# Patient Record
Sex: Male | Born: 1970 | Race: Black or African American | Hispanic: No | Marital: Married | Smoking: Light tobacco smoker
Health system: Southern US, Community
[De-identification: ages and names within clinical notes are randomized; demographics above are authoritative.]

## PROBLEM LIST (undated history)

## (undated) ENCOUNTER — Emergency Department (HOSPITAL_COMMUNITY): Payer: Self-pay

## (undated) DIAGNOSIS — F32A Depression, unspecified: Secondary | ICD-10-CM

## (undated) DIAGNOSIS — K219 Gastro-esophageal reflux disease without esophagitis: Secondary | ICD-10-CM

## (undated) DIAGNOSIS — J189 Pneumonia, unspecified organism: Secondary | ICD-10-CM

## (undated) DIAGNOSIS — F419 Anxiety disorder, unspecified: Secondary | ICD-10-CM

## (undated) DIAGNOSIS — Z21 Asymptomatic human immunodeficiency virus [HIV] infection status: Secondary | ICD-10-CM

## (undated) HISTORY — DX: Asymptomatic human immunodeficiency virus (hiv) infection status: Z21

## (undated) HISTORY — PX: PALATOPLASTY: SHX2153

## (undated) HISTORY — DX: Depression, unspecified: F32.A

## (undated) HISTORY — DX: Anxiety disorder, unspecified: F41.9

## (undated) HISTORY — PX: OTHER SURGICAL HISTORY: SHX169

---

## 2004-07-15 ENCOUNTER — Emergency Department (HOSPITAL_COMMUNITY): Admission: EM | Admit: 2004-07-15 | Discharge: 2004-07-16 | Payer: Self-pay | Admitting: *Deleted

## 2011-08-30 ENCOUNTER — Encounter (HOSPITAL_COMMUNITY): Payer: Self-pay

## 2011-08-30 ENCOUNTER — Emergency Department (HOSPITAL_COMMUNITY)
Admission: EM | Admit: 2011-08-30 | Discharge: 2011-08-30 | Disposition: A | Discharge status: Home / Self Care | Payer: Managed Care, Other (non HMO) | Attending: Emergency Medicine | Admitting: Emergency Medicine

## 2011-08-30 ENCOUNTER — Emergency Department (HOSPITAL_COMMUNITY): Payer: Managed Care, Other (non HMO)

## 2011-08-30 DIAGNOSIS — R0602 Shortness of breath: Secondary | ICD-10-CM | POA: Insufficient documentation

## 2011-08-30 DIAGNOSIS — R509 Fever, unspecified: Secondary | ICD-10-CM | POA: Insufficient documentation

## 2011-08-30 DIAGNOSIS — R000 Tachycardia, unspecified: Secondary | ICD-10-CM | POA: Insufficient documentation

## 2011-08-30 DIAGNOSIS — R079 Chest pain, unspecified: Secondary | ICD-10-CM | POA: Insufficient documentation

## 2011-08-30 DIAGNOSIS — R0682 Tachypnea, not elsewhere classified: Secondary | ICD-10-CM | POA: Insufficient documentation

## 2011-08-30 DIAGNOSIS — J189 Pneumonia, unspecified organism: Secondary | ICD-10-CM

## 2011-08-30 DIAGNOSIS — R51 Headache: Secondary | ICD-10-CM | POA: Insufficient documentation

## 2011-08-30 DIAGNOSIS — R112 Nausea with vomiting, unspecified: Secondary | ICD-10-CM | POA: Insufficient documentation

## 2011-08-30 MED ORDER — LEVOFLOXACIN 500 MG PO TABS: 500.0000 mg | ORAL_TABLET | Freq: Every day | ORAL | Status: AC

## 2011-08-30 MED ORDER — LEVOFLOXACIN 750 MG PO TABS
750.0000 mg | ORAL_TABLET | Freq: Every day | ORAL | Status: DC
  Administered 2011-08-30: 750 mg via ORAL
  Filled 2011-08-30: qty 1

## 2011-08-30 MED ORDER — HYDROCOD POLST-CHLORPHEN POLST 10-8 MG/5ML PO LQCR
5.0000 mL | Freq: Once | ORAL | Status: AC
  Administered 2011-08-30: 5 mL via ORAL
  Filled 2011-08-30: qty 5

## 2011-08-30 NOTE — ED Notes (Signed)
Sts feels like pna, congestion, fullness sob and nausea. Started over the weekend.

## 2011-08-30 NOTE — Discharge Instructions (Signed)
Your chest x-ray shows pneumonia in both lungs.  Use Levaquin for infection.  Continue your cough medicines, and Tylenol or Motrin for fevers.  Followup with your Dr. if your symptoms.  Last more than 3-4 days after you have started the antibiotic.  Return for worse or uncontrolled symptoms.  Stop smoking and don't resume it in the future

## 2011-08-30 NOTE — ED Notes (Signed)
Pt discharged home. Vital signs stable. Had no further questions.

## 2011-08-30 NOTE — ED Provider Notes (Signed)
History     CSN: [id number]  Arrival date & time 08/30/11  9528   First MD Initiated Contact with Patient 08/30/11 1105      Chief Complaint  Patient presents with  . Headache  . Ear Fullness  . Nausea    (Consider location/radiation/quality/duration/timing/severity/associated sxs/prior treatment) Patient is a 41 y.o. male presenting with headaches and plugged ear sensation. The history is provided by the patient.  Headache  Associated symptoms include a fever, shortness of breath, nausea and vomiting.  Ear Fullness Associated symptoms include headaches and shortness of breath. Pertinent negatives include no chest pain.   the patient is a 41 year old, male, who smokes cigarettes.  He has no past medical history.  He presents to emergency department complaining of sinus congestion, headache, nonproductive cough, and rattling sensation in his chest.  Since last week.  He also has had occasional nausea, vomiting, and intermittent fevers.  He was seen by another physician, and placed on Tamiflu.  He is not improve, so he presented to the emergency department for reevaluation.  He denies pain.  Past Medical History  Diagnosis Date  . Asthma     History reviewed. No pertinent past surgical history.  History reviewed. No pertinent family history.  History  Substance Use Topics  . Smoking status: Current Everyday Smoker  . Smokeless tobacco: Not on file  . Alcohol Use: No      Review of Systems  Constitutional: Positive for fever and chills.  HENT: Positive for congestion.   Respiratory: Positive for cough and shortness of breath. Negative for chest tightness.   Cardiovascular: Negative for chest pain.  Gastrointestinal: Positive for nausea and vomiting.  Skin: Negative for rash.  Neurological: Positive for headaches.  Psychiatric/Behavioral: Negative for confusion.  All other systems reviewed and are negative.    Allergies  Review of patient's allergies indicates no  known allergies.  Home Medications   Current Outpatient Rx  Name Route Sig Dispense Refill  . BENZONATATE 100 MG PO CAPS Oral Take 200 mg by mouth 3 (three) times daily as needed. For cough    . OSELTAMIVIR PHOSPHATE 75 MG PO CAPS Oral Take 75 mg by mouth 2 (two) times daily.    Marland Kitchen PSEUDOEPHEDRINE HCL 30 MG PO TABS Oral Take 60 mg by mouth every 4 (four) hours as needed. For sinus congestion      BP 131/72  Pulse 110  Temp(Src) 98 F (36.7 C) (Oral)  Resp 20  SpO2 92%  Physical Exam  Vitals reviewed. Constitutional: He is oriented to person, place, and time. He appears well-developed and well-nourished. No distress.       Hacking cough, speaks in full sentences without dyspnea  HENT:  Head: Normocephalic and atraumatic.  Eyes: Conjunctivae are normal. Pupils are equal, round, and reactive to light.  Neck: Normal range of motion. Neck supple.  Cardiovascular: Regular rhythm.        Tachycardia  Pulmonary/Chest: Effort normal. He has no wheezes. He has rales.       Tachypnea Scattered rales  Abdominal: Soft. There is no tenderness.  Musculoskeletal: Normal range of motion.  Neurological: He is alert and oriented to person, place, and time.  Skin: Skin is warm and dry.  Psychiatric: He has a normal mood and affect. Thought content normal.    ED Course  Procedures (including critical care time) 41 year old, healthy, male, with no past medical history presents emergency Department with a nonproductive cough, intermittent fevers, and headache for approximately a week.  He was placed on Tamiflu, but his symptoms have progressed.  Chest x-ray shows multi-focal pneumonia.  He got mild hypoxia, as well.  We will treat him as an outpatient since he is young, healthy, male.  Labs Reviewed - No data to display Dg Chest 2 View  08/30/2011  *RADIOLOGY REPORT*  Clinical Data: Chest pain, fever and cough.  CHEST - 2 VIEW  Comparison: None.  Findings: Trachea is midline.  Heart size normal.   There is patchy bilateral air space disease with volume loss at the right lung base.  No definite pleural fluid.  IMPRESSION: Patchy bilateral air space disease is most consistent with multilobar pneumonia.  Original Report Authenticated By: Reyes Ivan, M.D.     No diagnosis found.    MDM  Community acquired pneumonia in young, healthy, male, with no significant past medical history.  Mild hypoxia, but no respiratory distress.  Other than tachypnea with treat as outpatient        Cheri Guppy, MD 08/30/11 1123

## 2011-09-07 ENCOUNTER — Encounter (HOSPITAL_COMMUNITY): Payer: Self-pay | Admitting: *Deleted

## 2011-09-07 ENCOUNTER — Emergency Department (INDEPENDENT_AMBULATORY_CARE_PROVIDER_SITE_OTHER): Payer: Managed Care, Other (non HMO)

## 2011-09-07 ENCOUNTER — Emergency Department (INDEPENDENT_AMBULATORY_CARE_PROVIDER_SITE_OTHER)
Admission: EM | Admit: 2011-09-07 | Discharge: 2011-09-07 | Disposition: A | Discharge status: Home Health | Payer: Managed Care, Other (non HMO) | Source: Home / Self Care | Attending: Family Medicine | Admitting: Family Medicine

## 2011-09-07 DIAGNOSIS — J189 Pneumonia, unspecified organism: Secondary | ICD-10-CM

## 2011-09-07 MED ORDER — LEVOFLOXACIN 500 MG PO TABS: 500.0000 mg | ORAL_TABLET | Freq: Every day | ORAL | Status: AC

## 2011-09-07 NOTE — ED Notes (Signed)
Pt reports he was Dx'd with the flu 08/26/2011 and pneumonia 08/30/11.  He is asking for a note to return to work and he still has rib/chest soreness from  moving and coughing

## 2011-09-07 NOTE — Discharge Instructions (Signed)
Finish all of medicine, drink lots of water, return in 1 week for repeat x-ray and release for work.

## 2011-09-07 NOTE — ED Provider Notes (Addendum)
History     CSN: [id number]  Arrival date & time 09/07/11  1247   First MD Initiated Contact with Patient 09/07/11 1305      Chief Complaint  Patient presents with  . Pneumonia    (Consider location/radiation/quality/duration/timing/severity/associated sxs/prior treatment) Patient is a 41 y.o. male presenting with pneumonia. The history is provided by the patient.  Pneumonia This is a new problem. The current episode started more than 1 week ago (seen in ER 3/18 and dx with pneumonia, still coughing, fever.). The problem has not changed since onset.Associated symptoms include chest pain and shortness of breath. The symptoms are aggravated by coughing (quit smoking 10 d ago.).    Past Medical History  Diagnosis Date  . Asthma     History reviewed. No pertinent past surgical history.  History reviewed. No pertinent family history.  History  Substance Use Topics  . Smoking status: Current Everyday Smoker  . Smokeless tobacco: Not on file  . Alcohol Use: No      Review of Systems  Constitutional: Positive for fever and fatigue.  HENT: Negative.   Respiratory: Positive for cough, chest tightness and shortness of breath.   Cardiovascular: Positive for chest pain.    Allergies  Review of patient's allergies indicates no known allergies.  Home Medications   Current Outpatient Rx  Name Route Sig Dispense Refill  . BENZONATATE 100 MG PO CAPS Oral Take 200 mg by mouth 3 (three) times daily as needed. For cough    . LEVOFLOXACIN 500 MG PO TABS Oral Take 1 tablet (500 mg total) by mouth daily. 10 tablet 0  . OSELTAMIVIR PHOSPHATE 75 MG PO CAPS Oral Take 75 mg by mouth 2 (two) times daily.    Marland Kitchen PSEUDOEPHEDRINE HCL 30 MG PO TABS Oral Take 60 mg by mouth every 4 (four) hours as needed. For sinus congestion    . LEVOFLOXACIN 500 MG PO TABS Oral Take 1 tablet (500 mg total) by mouth daily. 7 tablet 0    BP 126/78  Pulse 115  Temp(Src) 98.2 F (36.8 C) (Oral)  Resp 16   SpO2 96%  Physical Exam  Nursing note and vitals reviewed. Constitutional: He appears well-developed and well-nourished.  HENT:  Head: Normocephalic.  Right Ear: External ear normal.  Left Ear: External ear normal.  Nose: Nose normal.  Mouth/Throat: Oropharynx is clear and moist.  Eyes: Conjunctivae are normal. Pupils are equal, round, and reactive to light.  Neck: Normal range of motion. Neck supple.  Pulmonary/Chest: He has wheezes. He has rhonchi. He has rales. He exhibits tenderness.  Lymphadenopathy:    He has no cervical adenopathy.  Skin: Skin is warm and dry.    ED Course  Procedures (including critical care time)  Labs Reviewed - No data to display Dg Chest 2 View  09/07/2011  *RADIOLOGY REPORT*  Clinical Data: Cough, fever, recent pneumonia  CHEST - 2 VIEW  Comparison: 08/30/2011  Findings: Residual linear/patchy right upper lobe opacity.  Linear left upper lobe/lingular opacity.  Patchy retrocardiac/ left lower lobe opacity.  These findings are improved and compatible with resolving pneumonia.  No pleural effusion or pneumothorax.  Cardiomediastinal silhouette is within normal limits.  Visualized osseous structures are within normal limits.  IMPRESSION: Improving multifocal pneumonia, as described above.  Original Report Authenticated By: Charline Bills, M.D.     1. Community acquired pneumonia       MDM  X-rays reviewed and report per radiologist.  Linna Hoff, MD 09/07/11 1441  Linna Hoff, MD 09/07/11 [phone]

## 2011-09-10 NOTE — ED Notes (Signed)
Pt had brought Leave of Absence paperwork in to be filled out .  Per MD work note written on 3/18 visit pt was to return 3/21 which per his paperwork he had to be out "in excess of 3 days" so he does not qualify from this visit.  Spoke w/Ian case Production designer, theatre/television/film for him through his insurance and informed of this .  He stated he was requesting medical records and I told him pt would need to come and pick up copy of them.  Pt paperwork placed in [place] envelope and taken to check in desk in ER.

## 2011-09-14 ENCOUNTER — Ambulatory Visit (INDEPENDENT_AMBULATORY_CARE_PROVIDER_SITE_OTHER): Payer: Managed Care, Other (non HMO) | Admitting: Family Medicine

## 2011-09-14 VITALS — BP 130/76 | HR 93 | Temp 98.6°F | Resp 16 | Ht 73.5 in | Wt 182.4 lb

## 2011-09-14 DIAGNOSIS — J40 Bronchitis, not specified as acute or chronic: Secondary | ICD-10-CM

## 2011-09-14 DIAGNOSIS — N529 Male erectile dysfunction, unspecified: Secondary | ICD-10-CM

## 2011-09-14 MED ORDER — METHYLPREDNISOLONE 4 MG PO KIT: PACK | ORAL | Status: AC

## 2011-09-14 MED ORDER — TADALAFIL 20 MG PO TABS: 10.0000 mg | ORAL_TABLET | ORAL | Status: DC | PRN

## 2011-09-14 NOTE — Progress Notes (Signed)
41 yo man with pneumonia dx'd March 18th.  Was initially sick on March 11th, but worsened until seen at the ED.  He has continued to cough despite the antibiotics, but he feels much better overall.  Also complains about ED. Cough is non productive.  No fever.  O: NAD Alert and cooperative. Chest:  Bilateral inspiratory wheezes Heart: reg, no murmur  A:  ED, persistent bronchitis  P:  Cialis 20 mg #6 1 prn Medrol dospak Counseled on smoking

## 2011-09-15 ENCOUNTER — Telehealth: Payer: Self-pay

## 2011-09-15 DIAGNOSIS — N529 Male erectile dysfunction, unspecified: Secondary | ICD-10-CM

## 2011-09-15 NOTE — Telephone Encounter (Signed)
Pt states he was rx'd 3 things recently - states cannot remember names - and they were sent to [facility] [address]. States too expensive and looked up a pharmacy online called [facility]. He will go ahead and fill what he currently needs, but from now on would like his rx's sent to this online pharmacy. Wants to know if and how we are able to do that.   Best: [phone]

## 2011-09-16 MED ORDER — TADALAFIL 20 MG PO TABS: 10.0000 mg | ORAL_TABLET | ORAL | Status: DC | PRN

## 2011-09-16 NOTE — Telephone Encounter (Signed)
faxeed Rx to # provided by pt and notified pt done

## 2011-09-16 NOTE — Telephone Encounter (Signed)
Signed at Tl desk to be faxed.

## 2011-09-16 NOTE — Telephone Encounter (Signed)
LMOM and pt called right back. He reqs that his Cialis 20 Rx be sent to [facility]. I do not see the pharmacy listed in Epic. Called # [phone] and recording stated new Rxs need to be faxed to [phone]. Pt states that it looks like a 30 day supply can be ordered and he prefers the Rx be sent in as #6 and 11 RFs as Rxd locally. Can we please print off and fax Rx in?

## 2012-06-14 ENCOUNTER — Telehealth: Payer: Self-pay | Admitting: *Deleted

## 2012-06-14 NOTE — Telephone Encounter (Signed)
[email or web address] is requesting refill for cialis 20mg  #28. Phone [phone].  Fax [phone]

## 2012-06-14 NOTE — Telephone Encounter (Signed)
Left message for him to call me back.  

## 2012-06-14 NOTE — Telephone Encounter (Signed)
Faxed denial.

## 2012-06-14 NOTE — Telephone Encounter (Signed)
Please call this patient. He needs an OV for refills. Refill request denied.

## 2013-01-19 ENCOUNTER — Encounter (HOSPITAL_COMMUNITY): Payer: Self-pay

## 2013-01-19 ENCOUNTER — Emergency Department (HOSPITAL_COMMUNITY)
Admission: EM | Admit: 2013-01-19 | Discharge: 2013-01-19 | Disposition: A | Discharge status: Home / Self Care | Payer: Self-pay | Source: Home / Self Care | Attending: Family Medicine | Admitting: Family Medicine

## 2013-01-19 DIAGNOSIS — B9689 Other specified bacterial agents as the cause of diseases classified elsewhere: Secondary | ICD-10-CM

## 2013-01-19 DIAGNOSIS — H109 Unspecified conjunctivitis: Secondary | ICD-10-CM

## 2013-01-19 MED ORDER — DOXYCYCLINE HYCLATE 100 MG PO CAPS: 100.0000 mg | ORAL_CAPSULE | Freq: Two times a day (BID) | ORAL | Status: DC

## 2013-01-19 MED ORDER — IPRATROPIUM BROMIDE 0.06 % NA SOLN: 2.0000 | Freq: Four times a day (QID) | NASAL | Status: DC

## 2013-01-19 MED ORDER — TOBRAMYCIN 0.3 % OP SOLN: 1.0000 [drp] | Freq: Four times a day (QID) | OPHTHALMIC | Status: DC

## 2013-01-19 NOTE — ED Provider Notes (Signed)
  CSN: [id number]     Arrival date & time 01/19/13  1251 History     First MD Initiated Contact with Patient 01/19/13 1317     Chief Complaint  Patient presents with  . Nasal Congestion   (Consider location/radiation/quality/duration/timing/severity/associated sxs/prior Treatment) Patient is a 42 y.o. male presenting with URI. The history is provided by the patient.  URI Presenting symptoms: congestion, fatigue and rhinorrhea   Presenting symptoms: no ear pain and no fever   Severity:  Mild Onset quality:  Gradual Duration:  1 week Progression:  Unchanged Chronicity:  New Associated symptoms: no sinus pain   Risk factors: no recent illness and no sick contacts   Risk factors comment:  Smoker   Past Medical History  Diagnosis Date  . Asthma    History reviewed. No pertinent past surgical history. History reviewed. No pertinent family history. History  Substance Use Topics  . Smoking status: Former Smoker    Quit date: 08/24/2011  . Smokeless tobacco: Not on file  . Alcohol Use: No    Review of Systems  Constitutional: Positive for fatigue. Negative for fever.  HENT: Positive for congestion and rhinorrhea. Negative for ear pain, facial swelling and ear discharge.   Eyes: Positive for redness.  Respiratory: Negative.     Allergies  Review of patient's allergies indicates no known allergies.  Home Medications   Current Outpatient Rx  Name  Route  Sig  Dispense  Refill  . doxycycline (VIBRAMYCIN) 100 MG capsule   Oral   Take 1 capsule (100 mg total) by mouth 2 (two) times daily.   20 capsule   0   . ipratropium (ATROVENT) 0.06 % nasal spray   Nasal   Place 2 sprays into the nose 4 (four) times daily.   15 mL   1   . EXPIRED: tadalafil (CIALIS) 20 MG tablet   Oral   Take 0.5-1 tablets (10-20 mg total) by mouth every other day as needed for erectile dysfunction.   6 tablet   11   . tobramycin (TOBREX) 0.3 % ophthalmic solution   Both Eyes   Place 1 drop  into both eyes every 6 (six) hours.   5 mL   0    BP 129/74  Pulse 79  Temp(Src) 98.2 F (36.8 C) (Oral)  Resp 16  SpO2 100% Physical Exam  Nursing note and vitals reviewed. Constitutional: He is oriented to person, place, and time. He appears well-developed and well-nourished.  HENT:  Head: Normocephalic.  Right Ear: External ear normal.  Left Ear: External ear normal.  Nose: Mucosal edema and rhinorrhea present.  Mouth/Throat: Oropharynx is clear and moist.  Eyes: Pupils are equal, round, and reactive to light. Left eye exhibits discharge. Left conjunctiva is injected.  Neck: Normal range of motion. Neck supple.  Cardiovascular: Regular rhythm.   Pulmonary/Chest: Breath sounds normal.  Lymphadenopathy:    He has no cervical adenopathy.  Neurological: He is alert and oriented to person, place, and time.  Skin: Skin is warm and dry.    ED Course   Procedures (including critical care time)  Labs Reviewed - No data to display No results found. 1. Sinusitis, bacterial   2. Conjunctivitis of left eye     MDM    Linna Hoff, MD 01/19/13 1347

## 2013-01-19 NOTE — ED Notes (Signed)
detailed information regarding [facility], [facility] to patient for his consideration

## 2013-01-19 NOTE — ED Notes (Signed)
C/o 1 week duration of cough, congestion; no relief w OTC medication

## 2015-02-07 ENCOUNTER — Emergency Department (HOSPITAL_COMMUNITY)
Admission: EM | Admit: 2015-02-07 | Discharge: 2015-02-07 | Disposition: A | Discharge status: Home / Self Care | Payer: 59 | Attending: Emergency Medicine | Admitting: Emergency Medicine

## 2015-02-07 ENCOUNTER — Encounter (HOSPITAL_COMMUNITY): Payer: Self-pay | Admitting: Emergency Medicine

## 2015-02-07 ENCOUNTER — Emergency Department (HOSPITAL_COMMUNITY): Payer: 59

## 2015-02-07 DIAGNOSIS — M549 Dorsalgia, unspecified: Secondary | ICD-10-CM | POA: Insufficient documentation

## 2015-02-07 DIAGNOSIS — J3489 Other specified disorders of nose and nasal sinuses: Secondary | ICD-10-CM | POA: Insufficient documentation

## 2015-02-07 DIAGNOSIS — R509 Fever, unspecified: Secondary | ICD-10-CM | POA: Insufficient documentation

## 2015-02-07 DIAGNOSIS — J45909 Unspecified asthma, uncomplicated: Secondary | ICD-10-CM | POA: Diagnosis not present

## 2015-02-07 DIAGNOSIS — Z792 Long term (current) use of antibiotics: Secondary | ICD-10-CM | POA: Diagnosis not present

## 2015-02-07 DIAGNOSIS — Z79899 Other long term (current) drug therapy: Secondary | ICD-10-CM | POA: Diagnosis not present

## 2015-02-07 DIAGNOSIS — R0789 Other chest pain: Secondary | ICD-10-CM | POA: Diagnosis not present

## 2015-02-07 DIAGNOSIS — Z720 Tobacco use: Secondary | ICD-10-CM | POA: Diagnosis not present

## 2015-02-07 DIAGNOSIS — M25511 Pain in right shoulder: Secondary | ICD-10-CM | POA: Diagnosis present

## 2015-02-07 MED ORDER — IBUPROFEN 800 MG PO TABS: 800.0000 mg | ORAL_TABLET | Freq: Three times a day (TID) | ORAL | Status: DC

## 2015-02-07 MED ORDER — METHOCARBAMOL 500 MG PO TABS: 500.0000 mg | ORAL_TABLET | Freq: Two times a day (BID) | ORAL | Status: DC

## 2015-02-07 NOTE — ED Provider Notes (Signed)
CSN: [id number]     Arrival date & time 02/07/15  1804 History   This chart was scribed for non-physician practitioner, Ok Edwards, PA-C  working with Mancel Bale, MD by Freida Busman, ED Scribe. This patient was seen in room WTR6/WTR6 and the patient's care was started at 6:50 PM.     Chief Complaint  Patient presents with  . Muscle Pain    generalized muscle aches  . Fever    undocumented , c/o fever  . Headache  . Facial Pain    The history is provided by the patient. No language interpreter was used.     HPI Comments:  Carl Schultz is a 44 y.o. male who presents to the Emergency Department complaining of sudden onset muscle pain for 2 days. Pt states he was laying on his stomach when he felt a pain in his right shoulder. Pt notes the pain began radiating through to his right chest today. He has taken a muscle relaxer with little relief. He reports subjective fever for 1 day, and sinus pressure. He denies nausea, vomiting, diarrhea, and SOB.  Past Medical History  Diagnosis Date  . Asthma    History reviewed. No pertinent past surgical history. Family History  Problem Relation Age of Onset  . Hypertension Mother   . Hypertension Father   . Diabetes Father    Social History  Substance Use Topics  . Smoking status: Current Every Day Smoker  . Smokeless tobacco: None  . Alcohol Use: Yes     Comment: occ    Review of Systems  Constitutional: Positive for fever.  HENT: Positive for sinus pressure.   Respiratory: Negative for shortness of breath.   Cardiovascular: Positive for chest pain.  Gastrointestinal: Negative for nausea, vomiting and diarrhea.  Musculoskeletal: Positive for back pain.  All other systems reviewed and are negative.   Allergies  Review of patient's allergies indicates no known allergies.  Home Medications   Prior to Admission medications   Medication Sig Start Date End Date Taking? Authorizing Provider  doxycycline (VIBRAMYCIN) 100  MG capsule Take 1 capsule (100 mg total) by mouth 2 (two) times daily. 01/19/13   Linna Hoff, MD  ipratropium (ATROVENT) 0.06 % nasal spray Place 2 sprays into the nose 4 (four) times daily. 01/19/13   Linna Hoff, MD  tadalafil (CIALIS) 20 MG tablet Take 0.5-1 tablets (10-20 mg total) by mouth every other day as needed for erectile dysfunction. 09/16/11 10/16/11  Morrell Riddle, PA-C  tobramycin (TOBREX) 0.3 % ophthalmic solution Place 1 drop into both eyes every 6 (six) hours. 01/19/13   Linna Hoff, MD   BP 122/74 mmHg  Pulse 104  Temp(Src) 99.5 F (37.5 C) (Oral)  Resp 20  Wt 200 lb (90.719 kg)  SpO2 98% Physical Exam  Constitutional: He is oriented to person, place, and time. He appears well-developed and well-nourished. No distress.  HENT:  Head: Normocephalic and atraumatic.  Eyes: Conjunctivae are normal.  Neck: Normal range of motion.  Cardiovascular: Normal rate.   Pulmonary/Chest: Effort normal.  Tender right chest.    Musculoskeletal: Normal range of motion.  Neurological: He is alert and oriented to person, place, and time.  Skin: Skin is warm and dry.  Psychiatric: He has a normal mood and affect. His behavior is normal.  Nursing note and vitals reviewed.   ED Course  Procedures   DIAGNOSTIC STUDIES:  Oxygen Saturation is 98% on RA, normal by my interpretation.  COORDINATION OF CARE:  6:55 PM Discussed treatment plan with pt at bedside and pt agreed to plan.  Labs Review Labs Reviewed - No data to display  Imaging Review Dg Chest 2 View  02/07/2015   CLINICAL DATA:  Patient complains of right sided pain, patient states "the pain in my right shoulder blade started about 3 nights ago, but now it's more in my right chest area (radiating through chest)." Hx of "respiratory issues" per patient -- asthma, and pneumonia X 6. Current every day smoker.  EXAM: CHEST  2 VIEW  COMPARISON:  09/07/2011  FINDINGS: There is linear and reticular opacity in both lung bases,  right greater than left, most likely atelectasis.  No lung consolidation or edema. No pleural effusion or pneumothorax.  Cardiac silhouette is normal in size. No mediastinal or hilar masses or evidence of adenopathy.  Skeletal structures are unremarkable.  IMPRESSION: 1. Lung base opacity most consistent with atelectasis. No convincing pneumonia. No pulmonary edema.   Electronically Signed   By: Amie Portland M.D.   On: 02/07/2015 19:54   I have personally reviewed and evaluated these images and lab results as part of my medical decision-making.   EKG Interpretation None      MDM   Final diagnoses:  Chest wall pain    Ibuprofen Robaxin    Elson Areas, PA-C 02/07/15 2000  Mancel Bale, MD 02/07/15 [phone]

## 2015-02-07 NOTE — ED Notes (Signed)
Pt stated that he has muscle pain x 2 days. Also c/o fever x 1 day. Did not check temperature. Took muscle relaxant x 2, (not prescribed for this patient). Minimal change in muscle pain. Denies N/V, denies sore throat. C/o sinus pressure

## 2015-02-07 NOTE — Discharge Instructions (Signed)

## 2015-02-11 ENCOUNTER — Encounter (HOSPITAL_COMMUNITY): Payer: Self-pay | Admitting: Emergency Medicine

## 2015-02-11 ENCOUNTER — Inpatient Hospital Stay (HOSPITAL_COMMUNITY)
Admission: EM | Admit: 2015-02-11 | Discharge: 2015-02-14 | DRG: 194 | Disposition: A | Discharge status: Home / Self Care | Payer: 59 | Attending: Internal Medicine | Admitting: Internal Medicine

## 2015-02-11 ENCOUNTER — Emergency Department (HOSPITAL_COMMUNITY): Payer: 59

## 2015-02-11 DIAGNOSIS — Z8701 Personal history of pneumonia (recurrent): Secondary | ICD-10-CM

## 2015-02-11 DIAGNOSIS — N179 Acute kidney failure, unspecified: Secondary | ICD-10-CM | POA: Diagnosis not present

## 2015-02-11 DIAGNOSIS — Z79899 Other long term (current) drug therapy: Secondary | ICD-10-CM

## 2015-02-11 DIAGNOSIS — R51 Headache: Secondary | ICD-10-CM | POA: Diagnosis not present

## 2015-02-11 DIAGNOSIS — K219 Gastro-esophageal reflux disease without esophagitis: Secondary | ICD-10-CM | POA: Diagnosis present

## 2015-02-11 DIAGNOSIS — Z833 Family history of diabetes mellitus: Secondary | ICD-10-CM

## 2015-02-11 DIAGNOSIS — E860 Dehydration: Secondary | ICD-10-CM | POA: Diagnosis not present

## 2015-02-11 DIAGNOSIS — J189 Pneumonia, unspecified organism: Secondary | ICD-10-CM | POA: Diagnosis not present

## 2015-02-11 DIAGNOSIS — J45909 Unspecified asthma, uncomplicated: Secondary | ICD-10-CM | POA: Diagnosis present

## 2015-02-11 DIAGNOSIS — B20 Human immunodeficiency virus [HIV] disease: Secondary | ICD-10-CM | POA: Diagnosis present

## 2015-02-11 DIAGNOSIS — R519 Headache, unspecified: Secondary | ICD-10-CM | POA: Insufficient documentation

## 2015-02-11 DIAGNOSIS — K920 Hematemesis: Secondary | ICD-10-CM | POA: Diagnosis present

## 2015-02-11 DIAGNOSIS — E876 Hypokalemia: Secondary | ICD-10-CM | POA: Diagnosis present

## 2015-02-11 DIAGNOSIS — F17210 Nicotine dependence, cigarettes, uncomplicated: Secondary | ICD-10-CM | POA: Diagnosis present

## 2015-02-11 DIAGNOSIS — K30 Functional dyspepsia: Secondary | ICD-10-CM | POA: Diagnosis present

## 2015-02-11 DIAGNOSIS — Z21 Asymptomatic human immunodeficiency virus [HIV] infection status: Secondary | ICD-10-CM | POA: Diagnosis present

## 2015-02-11 DIAGNOSIS — Z8249 Family history of ischemic heart disease and other diseases of the circulatory system: Secondary | ICD-10-CM

## 2015-02-11 HISTORY — DX: Pneumonia, unspecified organism: J18.9

## 2015-02-11 HISTORY — DX: Gastro-esophageal reflux disease without esophagitis: K21.9

## 2015-02-11 LAB — CBC WITH DIFFERENTIAL/PLATELET
BASOS ABS: 0 10*3/uL (ref 0.0–0.1)
BASOS PCT: 0 % (ref 0–1)
EOS ABS: 0.1 10*3/uL (ref 0.0–0.7)
EOS PCT: 1 % (ref 0–5)
HCT: 33.5 % — ABNORMAL LOW (ref 39.0–52.0)
Hemoglobin: 11.7 g/dL — ABNORMAL LOW (ref 13.0–17.0)
Lymphocytes Relative: 7 % — ABNORMAL LOW (ref 12–46)
Lymphs Abs: 1.1 10*3/uL (ref 0.7–4.0)
MCH: 32.6 pg (ref 26.0–34.0)
MCHC: 34.9 g/dL (ref 30.0–36.0)
MCV: 93.3 fL (ref 78.0–100.0)
MONO ABS: 1.8 10*3/uL — AB (ref 0.1–1.0)
Monocytes Relative: 12 % (ref 3–12)
Neutro Abs: 12.3 10*3/uL — ABNORMAL HIGH (ref 1.7–7.7)
Neutrophils Relative %: 80 % — ABNORMAL HIGH (ref 43–77)
PLATELETS: 203 10*3/uL (ref 150–400)
RBC: 3.59 MIL/uL — ABNORMAL LOW (ref 4.22–5.81)
RDW: 13.3 % (ref 11.5–15.5)
WBC: 15.3 10*3/uL — ABNORMAL HIGH (ref 4.0–10.5)

## 2015-02-11 LAB — COMPREHENSIVE METABOLIC PANEL
ALBUMIN: 3.1 g/dL — AB (ref 3.5–5.0)
ALK PHOS: 61 U/L (ref 38–126)
ALT: 33 U/L (ref 17–63)
AST: 42 U/L — AB (ref 15–41)
Anion gap: 7 (ref 5–15)
BILIRUBIN TOTAL: 1.1 mg/dL (ref 0.3–1.2)
BUN: 26 mg/dL — AB (ref 6–20)
CALCIUM: 8.5 mg/dL — AB (ref 8.9–10.3)
CO2: 26 mmol/L (ref 22–32)
CREATININE: 1.98 mg/dL — AB (ref 0.61–1.24)
Chloride: 102 mmol/L (ref 101–111)
GFR calc Af Amer: 46 mL/min — ABNORMAL LOW (ref >60)
GFR calc non Af Amer: 40 mL/min — ABNORMAL LOW (ref >60)
GLUCOSE: 126 mg/dL — AB (ref 65–99)
Potassium: 3.8 mmol/L (ref 3.5–5.1)
Sodium: 135 mmol/L (ref 135–145)
TOTAL PROTEIN: 8.5 g/dL — AB (ref 6.5–8.1)

## 2015-02-11 LAB — URINE MICROSCOPIC-ADD ON

## 2015-02-11 LAB — CK: CK TOTAL: 157 U/L (ref 49–397)

## 2015-02-11 LAB — URINALYSIS, ROUTINE W REFLEX MICROSCOPIC
Glucose, UA: NEGATIVE mg/dL
Ketones, ur: NEGATIVE mg/dL
Nitrite: NEGATIVE
Protein, ur: 100 mg/dL — AB
SPECIFIC GRAVITY, URINE: 1.028 (ref 1.005–1.030)
pH: 5.5 (ref 5.0–8.0)

## 2015-02-11 LAB — I-STAT CG4 LACTIC ACID, ED: LACTIC ACID, VENOUS: 1.29 mmol/L (ref 0.5–2.0)

## 2015-02-11 MED ORDER — SODIUM CHLORIDE 0.9 % IV BOLUS (SEPSIS): 1000.0000 mL | Freq: Once | INTRAVENOUS | Status: DC

## 2015-02-11 MED ORDER — DEXTROSE 5 % IV SOLN
500.0000 mg | Freq: Once | INTRAVENOUS | Status: AC
  Administered 2015-02-11: 500 mg via INTRAVENOUS
  Filled 2015-02-11: qty 500

## 2015-02-11 MED ORDER — ONDANSETRON HCL 4 MG/2ML IJ SOLN: 4.0000 mg | Freq: Four times a day (QID) | INTRAMUSCULAR | Status: DC | PRN

## 2015-02-11 MED ORDER — ONDANSETRON HCL 8 MG PO TABS: 8.0000 mg | ORAL_TABLET | Freq: Four times a day (QID) | ORAL | Status: DC | PRN

## 2015-02-11 MED ORDER — HEPARIN SODIUM (PORCINE) 5000 UNIT/ML IJ SOLN
5000.0000 [IU] | Freq: Three times a day (TID) | INTRAMUSCULAR | Status: DC
  Filled 2015-02-11: qty 1

## 2015-02-11 MED ORDER — METOCLOPRAMIDE HCL 5 MG/ML IJ SOLN
10.0000 mg | Freq: Once | INTRAMUSCULAR | Status: AC
  Administered 2015-02-11: 10 mg via INTRAVENOUS
  Filled 2015-02-11: qty 2

## 2015-02-11 MED ORDER — DEXTROSE 5 % IV SOLN
1.0000 g | Freq: Once | INTRAVENOUS | Status: AC
  Administered 2015-02-11: 1 g via INTRAVENOUS
  Filled 2015-02-11: qty 10

## 2015-02-11 MED ORDER — SODIUM CHLORIDE 0.9 % IV SOLN
INTRAVENOUS | Status: DC
  Administered 2015-02-11 – 2015-02-13 (×5): via INTRAVENOUS
  Administered 2015-02-14: 1000 mL via INTRAVENOUS

## 2015-02-11 MED ORDER — ACETAMINOPHEN 650 MG RE SUPP: 650.0000 mg | Freq: Four times a day (QID) | RECTAL | Status: DC | PRN

## 2015-02-11 MED ORDER — SODIUM CHLORIDE 0.9 % IV BOLUS (SEPSIS)
1000.0000 mL | Freq: Once | INTRAVENOUS | Status: AC
  Administered 2015-02-11: 1000 mL via INTRAVENOUS

## 2015-02-11 MED ORDER — OXYCODONE HCL 5 MG PO TABS
5.0000 mg | ORAL_TABLET | ORAL | Status: DC | PRN
  Administered 2015-02-12 – 2015-02-13 (×5): 5 mg via ORAL
  Filled 2015-02-11 (×6): qty 1

## 2015-02-11 MED ORDER — AZITHROMYCIN 250 MG PO TABS: 250.0000 mg | ORAL_TABLET | Freq: Every day | ORAL | Status: DC

## 2015-02-11 MED ORDER — DIPHENHYDRAMINE HCL 50 MG/ML IJ SOLN
25.0000 mg | Freq: Once | INTRAMUSCULAR | Status: AC
  Administered 2015-02-11: 25 mg via INTRAVENOUS
  Filled 2015-02-11: qty 1

## 2015-02-11 MED ORDER — ACETAMINOPHEN 325 MG PO TABS
650.0000 mg | ORAL_TABLET | Freq: Once | ORAL | Status: AC
  Administered 2015-02-11: 650 mg via ORAL
  Filled 2015-02-11: qty 2

## 2015-02-11 MED ORDER — ACETAMINOPHEN 325 MG PO TABS
650.0000 mg | ORAL_TABLET | Freq: Four times a day (QID) | ORAL | Status: DC | PRN
  Administered 2015-02-12 – 2015-02-13 (×3): 650 mg via ORAL
  Filled 2015-02-11 (×3): qty 2

## 2015-02-11 NOTE — ED Notes (Signed)
Patient transported to X-ray 

## 2015-02-11 NOTE — ED Notes (Signed)
Pt states that headache started last Thursday, which it is causing him a hard time holding his head up straight.  Pt states that today he vomited today once that had very little bright red blood in it. Pt states that the vomiting last about 10 mins.  Pt also states that he is nauseated.

## 2015-02-11 NOTE — H&P (Signed)
[facility] History and Physical  Carl Schultz:[id number] DOB: 19-Jul-1970 DOA: 02/11/2015  Referring physician: Dr. Benjamin Stain PCP: Pcp Not In System   Chief Complaint: HA and nausea and hematemasis  HPI: Carl Schultz is a 44 y.o. male  5 days ago patient developed right sided mid back pain. Seen in the ED 4 days ago and diagnosed with musculoskeletal/chest wall pain and given Motrin 800 and Robaxin. Patient states he has had some relief with these medicines but only for a limited amount of time. 3 days ago patient developed chills, subjective fevers, body aches, and shortness of breath with exertion. Patient states that symptoms continue to worsen and this feels like "my typical pneumonia. "Patient denies any substernal chest pain, diaphoresis, neck stiffness, confusion. He does have an associated headache which is now almost nearly resolved in the ED. Patient also states that he has some lower right chest pain that is nonradiating and sometimes worsened with deep breathing. Intermittent cough without sputum.  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   Review of Systems:  Constitutional:  No weight loss, night sweats,  HEENT:  No Difficulty swallowing,Tooth/dental problems,Sore throat,  No sneezing, itching, ear ache, nasal congestion, post nasal drip,  Cardio-vascular:  No Orthopnea, PND, swelling in lower extremities, anasarca, dizziness, palpitations  GI:  No heartburn, indigestion, abdominal pain, nausea, vomiting, diarrhea, change in bowel habits, loss of appetite  Resp: per HPI Skin:  no rash or lesions.  GU:  no dysuria, change in color of urine, no urgency or frequency. No flank pain.  Musculoskeletal:   No joint pain or swelling. No decreased range of motion. No back pain.  Psych:  No change in mood or affect. No  depression or anxiety. No memory loss.   Past Medical History  Diagnosis Date  . Asthma     childhood  . Pneumonia    History reviewed. No pertinent past surgical history. Social History:  reports that he has been smoking Cigarettes.  He has been smoking about 0.50 packs per day. He does not have any smokeless tobacco history on file. He reports that he drinks alcohol. He reports that he does not use illicit drugs.  No Known Allergies  Family History  Problem Relation Age of Onset  . Hypertension Mother   . Hypertension Father   . Diabetes Father      Prior to Admission medications   Medication Sig Start Date End Date Taking? Authorizing Provider  ibuprofen (ADVIL,MOTRIN) 200 MG tablet Take 400 mg by mouth every 4 (four) hours as needed for fever or moderate pain.   Yes Historical Provider, MD  ibuprofen (ADVIL,MOTRIN) 800 MG tablet Take 1 tablet (800 mg total) by mouth 3 (three) times daily. Patient taking differently: Take 800 mg by mouth every 6 (six) hours as needed for fever or moderate pain.  02/07/15  Yes Lonia Skinner Sofia, PA-C  methocarbamol (ROBAXIN) 500 MG tablet Take 1 tablet (500 mg total) by mouth 2 (two) times daily. 02/07/15  Yes Elson Areas, PA-C   Physical Exam: Ceasar Mons Vitals:   02/11/15 1836 02/11/15 2113 02/11/15 2201  BP: 95/47 117/57   Pulse: 99 101   Temp: 98.4 F (36.9 C) 100.2 F (37.9 C) 100.6 F (38.1 C)  TempSrc: Oral Oral Oral  Resp: 18 20  SpO2: 98% 95%     Wt Readings from Last 3 Encounters:  02/07/15 90.719 kg (200 lb)  09/14/11 82.736 kg (182 lb 6.4 oz)    General:  Appears calm and comfortable Eyes:  PERRL, normal lids, irises & conjunctiva ENT:  Dry mm Neck:  no LAD, masses or thyromegaly Cardiovascular:  RRR, no m/r/g. No LE edema. Telemetry:  SR, no arrhythmias  Respiratory:  Mild increased effort, decreased breath sounds and decreased aeration of the right lung field with few crackles appreciated. No wheezing.. Abdomen:  soft,  ntnd Skin:  no rash or induration seen on limited exam Musculoskeletal:  grossly normal tone BUE/BLE Psychiatric:  grossly normal mood and affect, speech fluent and appropriate Neurologic:  grossly non-focal.          Labs on Admission:  Basic Metabolic Panel:  Recent Labs Lab 02/11/15 1910  NA 135  K 3.8  CL 102  CO2 26  GLUCOSE 126*  BUN 26*  CREATININE 1.98*  CALCIUM 8.5*   Liver Function Tests:  Recent Labs Lab 02/11/15 1910  AST 42*  ALT 33  ALKPHOS 61  BILITOT 1.1  PROT 8.5*  ALBUMIN 3.1*   No results for input(s): LIPASE, AMYLASE in the last 168 hours. No results for input(s): AMMONIA in the last 168 hours. CBC:  Recent Labs Lab 02/11/15 1910  WBC 15.3*  NEUTROABS 12.3*  HGB 11.7*  HCT 33.5*  MCV 93.3  PLT 203   Cardiac Enzymes:  Recent Labs Lab 02/11/15 1910  CKTOTAL 157    BNP (last 3 results) No results for input(s): BNP in the last 8760 hours.  ProBNP (last 3 results) No results for input(s): PROBNP in the last 8760 hours.  CBG: No results for input(s): GLUCAP in the last 168 hours.  Radiological Exams on Admission: Dg Chest 2 View  02/11/2015   CLINICAL DATA:  Cough, fever and chills for 5 days.  EXAM: CHEST  2 VIEW  COMPARISON:  February 07, 2015.  FINDINGS: The heart size and mediastinal contours are within normal limits. There is patchy pneumonia of right lung base. Minor atelectasis of left lung base is identified. There is no pulmonary edema. The visualized skeletal structures are unremarkable.  IMPRESSION: Right lung base pneumonia.   Electronically Signed   By: Sherian Rein M.D.   On: 02/11/2015 19:53     Assessment/Plan Principal Problem:   CAP (community acquired pneumonia) Active Problems:   AKI (acute kidney injury)   Dehydration   Headache   CAP: CXR and symptoms consistent with pneumonia. WBC 15.3 (left shift). Febrile. Lactic acid 1.9. On room air. Smoker. Ceftriaxone and azithromycin given in ED -MedSurg -   Continue Azithro 250mg  PO (pts admission primarily for AKI due to illness. Azithro should be sufficient to treat). Escalate as necessary if not improving.  - Sputum Cx - Legionella Ag, Strep Ag  AKI: no previous elevated Cr and no h/o CKD. Cr 1.98. Likely from dehydration. UA showing Hgb, bilirubin, and protein but few bacteria and 0-3 WBC - NS 172ml/hr - 1L NS bolus - BMET in am - UCX - stop NSAIDs  Headache: nearly resolved. Low concern for meningitis. Likely from CAP and dehydration.  - monitor   Code Status: FUll DVT Prophylaxis: Hep Family Communication: WIfe Disposition Plan: Pending improvement  Pegi Milazzo Shela Commons, MD Family Medicine [facility] [email or web address] Password [email or web address]

## 2015-02-11 NOTE — ED Provider Notes (Signed)
CSN: [id number]     Arrival date & time 02/11/15  1815 History   First MD Initiated Contact with Patient 02/11/15 1848     Chief Complaint  Patient presents with  . Headache  . Hematemesis  . Nausea     Patient is a 44 y.o. male presenting with headaches. The history is provided by the patient. No language interpreter was used.  Headache  Carl Schultz presents for evaluation of headache. He's had 5 days of diffuse throbbing headache. It is predominantly frontal nature but does wrap around posteriorly. Headache was gradual in onset. He has associated chills, nausea, chest wall pain. He had one episode of vomiting. Symptoms are moderate, constant, worsening. He has no reported fevers but has not taken his temperature at home. He has a history of pneumonia and asthma. He's not coughing but he does feel a little short of breath. No known sick contacts.  Past Medical History  Diagnosis Date  . Asthma    History reviewed. No pertinent past surgical history. Family History  Problem Relation Age of Onset  . Hypertension Mother   . Hypertension Father   . Diabetes Father    Social History  Substance Use Topics  . Smoking status: Current Every Day Smoker  . Smokeless tobacco: None  . Alcohol Use: Yes     Comment: occ    Review of Systems  Neurological: Positive for headaches.  All other systems reviewed and are negative.     Allergies  Review of patient's allergies indicates no known allergies.  Home Medications   Prior to Admission medications   Medication Sig Start Date End Date Taking? Authorizing Provider  ibuprofen (ADVIL,MOTRIN) 200 MG tablet Take 400 mg by mouth every 4 (four) hours as needed for fever or moderate pain.   Yes Historical Provider, MD  ibuprofen (ADVIL,MOTRIN) 800 MG tablet Take 1 tablet (800 mg total) by mouth 3 (three) times daily. Patient taking differently: Take 800 mg by mouth every 6 (six) hours as needed for fever or moderate pain.  02/07/15  Yes Lonia Skinner Sofia, PA-C  methocarbamol (ROBAXIN) 500 MG tablet Take 1 tablet (500 mg total) by mouth 2 (two) times daily. 02/07/15  Yes Verlon Au K Sofia, PA-C   BP 95/47 mmHg  Pulse 99  Temp(Src) 98.4 F (36.9 C) (Oral)  Resp 18  SpO2 98% Physical Exam  Constitutional: He is oriented to person, place, and time. He appears well-developed and well-nourished.  HENT:  Head: Normocephalic and atraumatic.  Eyes: EOM are normal. Pupils are equal, round, and reactive to light.  Neck: Neck supple.  Cardiovascular: Normal rate and regular rhythm.   No murmur heard. Pulmonary/Chest: Effort normal and breath sounds normal. No respiratory distress.  Abdominal: Soft. There is no tenderness. There is no rebound and no guarding.  Musculoskeletal: He exhibits no edema or tenderness.  Neurological: He is alert and oriented to person, place, and time.  Skin: Skin is warm and dry.  Psychiatric: He has a normal mood and affect. His behavior is normal.  Nursing note and vitals reviewed.   ED Course  Procedures (including critical care time) Labs Review Labs Reviewed  COMPREHENSIVE METABOLIC PANEL - Abnormal; Notable for the following:    Glucose, Bld 126 (*)    BUN 26 (*)    Creatinine, Ser 1.98 (*)    Calcium 8.5 (*)    Total Protein 8.5 (*)    Albumin 3.1 (*)    AST 42 (*)  GFR calc non Af Amer 40 (*)    GFR calc Af Amer 46 (*)    All other components within normal limits  CBC WITH DIFFERENTIAL/PLATELET - Abnormal; Notable for the following:    WBC 15.3 (*)    RBC 3.59 (*)    Hemoglobin 11.7 (*)    HCT 33.5 (*)    Neutrophils Relative % 80 (*)    Neutro Abs 12.3 (*)    Lymphocytes Relative 7 (*)    Monocytes Absolute 1.8 (*)    All other components within normal limits  URINALYSIS, ROUTINE W REFLEX MICROSCOPIC (NOT AT [facility]) - Abnormal; Notable for the following:    Color, Urine ORANGE (*)    APPearance CLOUDY (*)    Hgb urine dipstick MODERATE (*)    Bilirubin Urine MODERATE (*)    Protein,  ur 100 (*)    Urobilinogen, UA >8.0 (*)    Leukocytes, UA TRACE (*)    All other components within normal limits  URINE MICROSCOPIC-ADD ON - Abnormal; Notable for the following:    Bacteria, UA FEW (*)    Casts GRANULAR CAST (*)    All other components within normal limits  CULTURE, BLOOD (ROUTINE X 2)  CULTURE, BLOOD (ROUTINE X 2)  CULTURE, EXPECTORATED SPUTUM-ASSESSMENT  GRAM STAIN  URINE CULTURE  CK  HIV ANTIBODY (ROUTINE TESTING)  LEGIONELLA ANTIGEN, URINE  STREP PNEUMONIAE URINARY ANTIGEN  CBC  CREATININE, SERUM  BASIC METABOLIC PANEL  I-STAT CG4 LACTIC ACID, ED    Imaging Review Dg Chest 2 View  02/11/2015   CLINICAL DATA:  Cough, fever and chills for 5 days.  EXAM: CHEST  2 VIEW  COMPARISON:  February 07, 2015.  FINDINGS: The heart size and mediastinal contours are within normal limits. There is patchy pneumonia of right lung base. Minor atelectasis of left lung base is identified. There is no pulmonary edema. The visualized skeletal structures are unremarkable.  IMPRESSION: Right lung base pneumonia.   Electronically Signed   By: Sherian Rein M.D.   On: 02/11/2015 19:53   I have personally reviewed and evaluated these images and lab results as part of my medical decision-making.   EKG Interpretation None      MDM   Final diagnoses:  CAP (community acquired pneumonia)    Patient here for evaluation of headache, shortness of breath, chest discomfort. Patient with no focal neurologic deficits on examination. History of presentation is not consistent with meningitis. Chest x-ray is consistent with pneumonia, treated with IV antibiotics for CAP. BMP demonstrates acute kidney injury, providing IV fluids. Discussed with hospitalist regarding admission for further management.  Tilden Fossa, MD 02/11/15 [phone]

## 2015-02-12 DIAGNOSIS — K30 Functional dyspepsia: Secondary | ICD-10-CM | POA: Diagnosis present

## 2015-02-12 DIAGNOSIS — J189 Pneumonia, unspecified organism: Secondary | ICD-10-CM | POA: Diagnosis present

## 2015-02-12 DIAGNOSIS — Z21 Asymptomatic human immunodeficiency virus [HIV] infection status: Secondary | ICD-10-CM | POA: Diagnosis present

## 2015-02-12 DIAGNOSIS — K920 Hematemesis: Secondary | ICD-10-CM | POA: Diagnosis present

## 2015-02-12 DIAGNOSIS — Z8701 Personal history of pneumonia (recurrent): Secondary | ICD-10-CM | POA: Diagnosis not present

## 2015-02-12 DIAGNOSIS — Z79899 Other long term (current) drug therapy: Secondary | ICD-10-CM | POA: Diagnosis not present

## 2015-02-12 DIAGNOSIS — Z8249 Family history of ischemic heart disease and other diseases of the circulatory system: Secondary | ICD-10-CM | POA: Diagnosis not present

## 2015-02-12 DIAGNOSIS — K219 Gastro-esophageal reflux disease without esophagitis: Secondary | ICD-10-CM | POA: Diagnosis present

## 2015-02-12 DIAGNOSIS — E860 Dehydration: Secondary | ICD-10-CM | POA: Diagnosis present

## 2015-02-12 DIAGNOSIS — N179 Acute kidney failure, unspecified: Secondary | ICD-10-CM

## 2015-02-12 DIAGNOSIS — F17210 Nicotine dependence, cigarettes, uncomplicated: Secondary | ICD-10-CM | POA: Diagnosis present

## 2015-02-12 DIAGNOSIS — J45909 Unspecified asthma, uncomplicated: Secondary | ICD-10-CM | POA: Diagnosis present

## 2015-02-12 DIAGNOSIS — Z833 Family history of diabetes mellitus: Secondary | ICD-10-CM | POA: Diagnosis not present

## 2015-02-12 DIAGNOSIS — E876 Hypokalemia: Secondary | ICD-10-CM | POA: Diagnosis present

## 2015-02-12 LAB — BASIC METABOLIC PANEL
ANION GAP: 7 (ref 5–15)
BUN: 25 mg/dL — AB (ref 6–20)
CALCIUM: 7.7 mg/dL — AB (ref 8.9–10.3)
CO2: 21 mmol/L — ABNORMAL LOW (ref 22–32)
Chloride: 107 mmol/L (ref 101–111)
Creatinine, Ser: 1.75 mg/dL — ABNORMAL HIGH (ref 0.61–1.24)
GFR calc Af Amer: 54 mL/min — ABNORMAL LOW (ref >60)
GFR calc non Af Amer: 46 mL/min — ABNORMAL LOW (ref >60)
GLUCOSE: 121 mg/dL — AB (ref 65–99)
Potassium: 3.1 mmol/L — ABNORMAL LOW (ref 3.5–5.1)
Sodium: 135 mmol/L (ref 135–145)

## 2015-02-12 LAB — STREP PNEUMONIAE URINARY ANTIGEN: STREP PNEUMO URINARY ANTIGEN: NEGATIVE

## 2015-02-12 LAB — CBC
HCT: 32.4 % — ABNORMAL LOW (ref 39.0–52.0)
HEMOGLOBIN: 11.1 g/dL — AB (ref 13.0–17.0)
MCH: 32.1 pg (ref 26.0–34.0)
MCHC: 34.3 g/dL (ref 30.0–36.0)
MCV: 93.6 fL (ref 78.0–100.0)
PLATELETS: 236 10*3/uL (ref 150–400)
RBC: 3.46 MIL/uL — AB (ref 4.22–5.81)
RDW: 13.8 % (ref 11.5–15.5)
WBC: 15.6 10*3/uL — ABNORMAL HIGH (ref 4.0–10.5)

## 2015-02-12 MED ORDER — PANTOPRAZOLE SODIUM 40 MG IV SOLR
40.0000 mg | Freq: Two times a day (BID) | INTRAVENOUS | Status: DC
  Administered 2015-02-12 – 2015-02-14 (×5): 40 mg via INTRAVENOUS
  Filled 2015-02-12 (×5): qty 40

## 2015-02-12 MED ORDER — POTASSIUM CHLORIDE CRYS ER 20 MEQ PO TBCR
20.0000 meq | EXTENDED_RELEASE_TABLET | Freq: Once | ORAL | Status: AC
  Administered 2015-02-12: 20 meq via ORAL
  Filled 2015-02-12: qty 1

## 2015-02-12 MED ORDER — NICOTINE 14 MG/24HR TD PT24
14.0000 mg | MEDICATED_PATCH | Freq: Every day | TRANSDERMAL | Status: DC
  Administered 2015-02-12 – 2015-02-14 (×3): 14 mg via TRANSDERMAL
  Filled 2015-02-12 (×3): qty 1

## 2015-02-12 MED ORDER — AZITHROMYCIN 250 MG PO TABS
500.0000 mg | ORAL_TABLET | Freq: Every day | ORAL | Status: DC
Start: 2015-02-12 — End: 2015-02-14
  Administered 2015-02-12 – 2015-02-14 (×3): 500 mg via ORAL
  Filled 2015-02-12 (×4): qty 2

## 2015-02-12 MED ORDER — FLUTICASONE PROPIONATE 50 MCG/ACT NA SUSP
1.0000 | Freq: Every day | NASAL | Status: DC
  Administered 2015-02-12 – 2015-02-14 (×3): 1 via NASAL
  Filled 2015-02-12: qty 16

## 2015-02-12 MED ORDER — DEXTROSE 5 % IV SOLN
1.0000 g | INTRAVENOUS | Status: DC
  Administered 2015-02-12 – 2015-02-14 (×3): 1 g via INTRAVENOUS
  Filled 2015-02-12 (×3): qty 10

## 2015-02-12 NOTE — Progress Notes (Deleted)
PHARMACIST - PHYSICIAN COMMUNICATION Key Points: Use following P&T approved IV to PO antibiotic change policy.  Description contains the criteria that are approved Note: Policy Excludes:  Esophagectomy patients  DR:   [facility] CONCERNING: IV to Oral Route Change Policy  RECOMMENDATION: This patient is receiving  by the intravenous route.  Based on criteria approved by the Pharmacy and Therapeutics Committee, the intravenous medication(s) is/are being converted to the equivalent oral dose form(s).   DESCRIPTION: These criteria include:  The patient is eating (either orally or via tube) and/or has been taking other orally administered medications for a least 24 hours  The patient has no evidence of active gastrointestinal bleeding or impaired GI absorption (gastrectomy, short bowel, patient on TNA or NPO).  If you have questions about this conversion, please contact the Pharmacy Department    [phone] )  [facility]   [phone] )  [facility]   [phone] )  [facility]   [phone] )  [facility]   [phone] )  Ilene Qua   Juliette Alcide, PharmD, BCPS.   Pager: [phone] 02/12/2015 1:17 PM

## 2015-02-12 NOTE — Progress Notes (Signed)
[facility] PROGRESS NOTE  Carl Schultz ZOX:[id number] DOB: 1970-10-27 DOA: 02/11/2015 PCP: Pcp Not In System  Assessment/Plan: 1-PNA;  Continue with ceftriaxone and Azithro.  Follow legionella antigen, and blood culture.  HIV pending.  WBC still elevated.   2-AKI; in setting NSAID, dehydration.  continue with IV fluids.  Strict I and O.  Cr trending down.   3-hematemesis; suspect mallory Weis tear. IV protonix. Follow hb trend. Avoid ibuprofen.  4-Headaches; improved. Feels sinus headaches pressure. Start flonase.   Code Status: full code.  Family Communication: care discussed with patient.  Disposition Plan: remain inpatient.    Consultants:  none  Procedures:  none  Antibiotics:  Ceftriaxone 8-30  Azithromycin 8-30  HPI/Subjective: He is feeling better today. No significant cough. Vomit once this am but no blood.  Denies abdominal pain.   Objective: Filed Vitals:   02/12/15 1307  BP: 135/75  Pulse: 103  Temp: 100.1 F (37.8 C)  Resp: 18    Intake/Output Summary (Last 24 hours) at 02/12/15 1555 Last data filed at 02/12/15 1400  Gross per 24 hour  Intake   1050 ml  Output    100 ml  Net    950 ml   Filed Weights   02/11/15 2259  Weight: 90.357 kg (199 lb 3.2 oz)    Exam:   General:  Alert in no distress.   Cardiovascular: S 1, S 2 RRR  Respiratory: CTA  Abdomen: BS present, soft, nt  Musculoskeletal: no edema   Data Reviewed: Basic Metabolic Panel:  Recent Labs Lab 02/11/15 1910 02/12/15 0015  NA 135 135  K 3.8 3.1*  CL 102 107  CO2 26 21*  GLUCOSE 126* 121*  BUN 26* 25*  CREATININE 1.98* 1.75*  CALCIUM 8.5* 7.7*   Liver Function Tests:  Recent Labs Lab 02/11/15 1910  AST 42*  ALT 33  ALKPHOS 61  BILITOT 1.1  PROT 8.5*  ALBUMIN 3.1*   No results for input(s): LIPASE, AMYLASE in the last 168 hours. No results for input(s): AMMONIA in the last 168 hours. CBC:  Recent Labs Lab 02/11/15 1910  02/12/15 0910  WBC 15.3* 15.6*  NEUTROABS 12.3*  --   HGB 11.7* 11.1*  HCT 33.5* 32.4*  MCV 93.3 93.6  PLT 203 236   Cardiac Enzymes:  Recent Labs Lab 02/11/15 1910  CKTOTAL 157   BNP (last 3 results) No results for input(s): BNP in the last 8760 hours.  ProBNP (last 3 results) No results for input(s): PROBNP in the last 8760 hours.  CBG: No results for input(s): GLUCAP in the last 168 hours.  Recent Results (from the past 240 hour(s))  Culture, blood (routine x 2) Call MD if unable to obtain prior to antibiotics being given     Status: None (Preliminary result)   Collection Time: 02/12/15 12:15 AM  Result Value Ref Range Status   Specimen Description   Final    BLOOD RIGHT HAND Performed at [facility]    Special Requests BOTTLES DRAWN AEROBIC ONLY 1CC  Final   Culture PENDING  Incomplete   Report Status PENDING  Incomplete  Culture, blood (routine x 2) Call MD if unable to obtain prior to antibiotics being given     Status: None (Preliminary result)   Collection Time: 02/12/15 12:15 AM  Result Value Ref Range Status   Specimen Description   Final    BLOOD RIGHT ARM Performed at [facility]    Special Requests BOTTLES DRAWN  AEROBIC AND ANAEROBIC 8CC  Final   Culture PENDING  Incomplete   Report Status PENDING  Incomplete     Studies: Dg Chest 2 View  02/11/2015   CLINICAL DATA:  Cough, fever and chills for 5 days.  EXAM: CHEST  2 VIEW  COMPARISON:  February 07, 2015.  FINDINGS: The heart size and mediastinal contours are within normal limits. There is patchy pneumonia of right lung base. Minor atelectasis of left lung base is identified. There is no pulmonary edema. The visualized skeletal structures are unremarkable.  IMPRESSION: Right lung base pneumonia.   Electronically Signed   By: Sherian Rein M.D.   On: 02/11/2015 19:53    Scheduled Meds: . azithromycin  500 mg Oral Daily  . cefTRIAXone (ROCEPHIN)  IV  1 g Intravenous Q24H  . fluticasone   1 spray Each Nare Daily  . nicotine  14 mg Transdermal Daily  . pantoprazole (PROTONIX) IV  40 mg Intravenous Q12H   Continuous Infusions: . sodium chloride 125 mL/hr at 02/12/15 1610    Principal Problem:   CAP (community acquired pneumonia) Active Problems:   AKI (acute kidney injury)   Dehydration   Headache    Time spent: 35 minutes.     Hartley Barefoot A  [facility] Pager  [phone]. If 7PM-7AM, please contact night-coverage at [email or web address], password [facility] 02/12/2015, 3:55 PM  LOS: 1 day

## 2015-02-13 LAB — URINE CULTURE: Culture: NO GROWTH

## 2015-02-13 LAB — BASIC METABOLIC PANEL
ANION GAP: 6 (ref 5–15)
BUN: 11 mg/dL (ref 6–20)
CALCIUM: 8.1 mg/dL — AB (ref 8.9–10.3)
CO2: 25 mmol/L (ref 22–32)
Chloride: 106 mmol/L (ref 101–111)
Creatinine, Ser: 0.99 mg/dL (ref 0.61–1.24)
Glucose, Bld: 107 mg/dL — ABNORMAL HIGH (ref 65–99)
POTASSIUM: 3.2 mmol/L — AB (ref 3.5–5.1)
Sodium: 137 mmol/L (ref 135–145)

## 2015-02-13 LAB — LEGIONELLA ANTIGEN, URINE

## 2015-02-13 LAB — CBC
HCT: 33.2 % — ABNORMAL LOW (ref 39.0–52.0)
Hemoglobin: 11.3 g/dL — ABNORMAL LOW (ref 13.0–17.0)
MCH: 31.8 pg (ref 26.0–34.0)
MCHC: 34 g/dL (ref 30.0–36.0)
MCV: 93.5 fL (ref 78.0–100.0)
PLATELETS: 298 10*3/uL (ref 150–400)
RBC: 3.55 MIL/uL — AB (ref 4.22–5.81)
RDW: 13.9 % (ref 11.5–15.5)
WBC: 15.5 10*3/uL — AB (ref 4.0–10.5)

## 2015-02-13 LAB — HIV 1/2 AB DIFFERENTIATION
HIV 1 AB: POSITIVE — AB
HIV 2 AB: NEGATIVE

## 2015-02-13 LAB — HIV ANTIBODY (ROUTINE TESTING W REFLEX)

## 2015-02-13 MED ORDER — ALUM & MAG HYDROXIDE-SIMETH 200-200-20 MG/5ML PO SUSP: 30.0000 mL | Freq: Four times a day (QID) | ORAL | Status: DC | PRN

## 2015-02-13 MED ORDER — POTASSIUM CHLORIDE CRYS ER 20 MEQ PO TBCR
40.0000 meq | EXTENDED_RELEASE_TABLET | Freq: Once | ORAL | Status: AC
  Administered 2015-02-13: 40 meq via ORAL
  Filled 2015-02-13: qty 2

## 2015-02-13 NOTE — Progress Notes (Addendum)
           [facility]   This patient tested + for HIV and I was alerted via the [facility] system  I will order some labs for tonight and others for tomorrow and ask Dr. Orvan Falconer to visit him in the hospital  I do not see record of prior HIV test at [facility] or [facility] via [facility].

## 2015-02-13 NOTE — Clinical Documentation Improvement (Signed)
Internal Medicine  Abnormal Lab/Test Results: K= 3.1, 3.2  Possible Clinical Conditions associated with below indicators  Hypokalemia  Other Condition  Cannot Clinically Determine  Treatment Provided: KCL 40 mEq x 1 dose KCL 20 mEq x 1 dose   Please exercise your independent, professional judgment when responding. A specific answer is not anticipated or expected.   Thank You,  Cherylann Ratel [employer] [place]

## 2015-02-13 NOTE — Progress Notes (Signed)
[facility] PROGRESS NOTE  Carl Schultz ZOX:[id number] DOB: Mar 06, 1971 DOA: 02/11/2015 PCP: Pcp Not In System  Assessment/Plan: 1-PNA;  Continue with ceftriaxone and Azithromycin day 3 legionella antigen negative, and blood culture.  HIV pending.  WBC still elevated.  Awaiting decreasing WBC to discharge patient.   2-AKI; in setting NSAID, dehydration.  continue with IV fluids.  Strict I and O.  Resolved with IV fluids.   3-Hematemesis; suspect mallory Weis tear. IV protonix BID. Follow hb trend. Avoid ibuprofen.  Start Maalox for indigestion. If hiccup persist will consider other medications.  Hb stable.   4-Headaches; improved. Feels sinus headaches pressure. Started flonase.   5-Hypokalemia; replete with oral KCL.     Code Status: full code.  Family Communication: care discussed with patient.  Disposition Plan: remain inpatient.    Consultants:  none  Procedures:  none  Antibiotics:  Ceftriaxone 8-30  Azithromycin 8-30  HPI/Subjective: Feeling ok, having reflux, hiccup/  Breathing better/   Objective: Filed Vitals:   02/13/15 1346  BP: 147/77  Pulse: 86  Temp: 98.6 F (37 C)  Resp: 18    Intake/Output Summary (Last 24 hours) at 02/13/15 1501 Last data filed at 02/13/15 0856  Gross per 24 hour  Intake   1580 ml  Output   1300 ml  Net    280 ml   Filed Weights   02/11/15 2259  Weight: 90.357 kg (199 lb 3.2 oz)    Exam:   General:  Alert in no distress.   Cardiovascular: S 1, S 2 RRR  Respiratory: CTA  Abdomen: BS present, soft, nt  Musculoskeletal: no edema   Data Reviewed: Basic Metabolic Panel:  Recent Labs Lab 02/11/15 1910 02/12/15 0015 02/13/15 0406  NA 135 135 137  K 3.8 3.1* 3.2*  CL 102 107 106  CO2 26 21* 25  GLUCOSE 126* 121* 107*  BUN 26* 25* 11  CREATININE 1.98* 1.75* 0.99  CALCIUM 8.5* 7.7* 8.1*   Liver Function Tests:  Recent Labs Lab 02/11/15 1910  AST 42*  ALT 33  ALKPHOS 61  BILITOT  1.1  PROT 8.5*  ALBUMIN 3.1*   No results for input(s): LIPASE, AMYLASE in the last 168 hours. No results for input(s): AMMONIA in the last 168 hours. CBC:  Recent Labs Lab 02/11/15 1910 02/12/15 0910 02/13/15 0406  WBC 15.3* 15.6* 15.5*  NEUTROABS 12.3*  --   --   HGB 11.7* 11.1* 11.3*  HCT 33.5* 32.4* 33.2*  MCV 93.3 93.6 93.5  PLT 203 236 298   Cardiac Enzymes:  Recent Labs Lab 02/11/15 1910  CKTOTAL 157   BNP (last 3 results) No results for input(s): BNP in the last 8760 hours.  ProBNP (last 3 results) No results for input(s): PROBNP in the last 8760 hours.  CBG: No results for input(s): GLUCAP in the last 168 hours.  Recent Results (from the past 240 hour(s))  Culture, blood (routine x 2) Call MD if unable to obtain prior to antibiotics being given     Status: None (Preliminary result)   Collection Time: 02/12/15 12:15 AM  Result Value Ref Range Status   Specimen Description BLOOD RIGHT HAND  Final   Special Requests BOTTLES DRAWN AEROBIC ONLY 1CC  Final   Culture   Final    NO GROWTH 1 DAY Performed at [facility]    Report Status PENDING  Incomplete  Culture, blood (routine x 2) Call MD if unable to obtain prior to antibiotics being given  Status: None (Preliminary result)   Collection Time: 02/12/15 12:15 AM  Result Value Ref Range Status   Specimen Description BLOOD RIGHT ARM  Final   Special Requests BOTTLES DRAWN AEROBIC AND ANAEROBIC 8CC  Final   Culture   Final    NO GROWTH 1 DAY Performed at [facility]    Report Status PENDING  Incomplete  Culture, Urine     Status: None   Collection Time: 02/12/15  9:52 AM  Result Value Ref Range Status   Specimen Description URINE, CLEAN CATCH  Final   Special Requests NONE  Final   Culture   Final    NO GROWTH 1 DAY Performed at [facility]    Report Status 02/13/2015 FINAL  Final     Studies: Dg Chest 2 View  02/11/2015   CLINICAL DATA:  Cough, fever and chills for 5  days.  EXAM: CHEST  2 VIEW  COMPARISON:  February 07, 2015.  FINDINGS: The heart size and mediastinal contours are within normal limits. There is patchy pneumonia of right lung base. Minor atelectasis of left lung base is identified. There is no pulmonary edema. The visualized skeletal structures are unremarkable.  IMPRESSION: Right lung base pneumonia.   Electronically Signed   By: Sherian Rein M.D.   On: 02/11/2015 19:53    Scheduled Meds: . azithromycin  500 mg Oral Daily  . cefTRIAXone (ROCEPHIN)  IV  1 g Intravenous Q24H  . fluticasone  1 spray Each Nare Daily  . nicotine  14 mg Transdermal Daily  . pantoprazole (PROTONIX) IV  40 mg Intravenous Q12H   Continuous Infusions: . sodium chloride 100 mL/hr at 02/13/15 1610    Principal Problem:   CAP (community acquired pneumonia) Active Problems:   AKI (acute kidney injury)   Dehydration   Headache    Time spent: 35 minutes.     Hartley Barefoot A  [facility] Pager  [phone]. If 7PM-7AM, please contact night-coverage at [email or web address], password [facility] 02/13/2015, 3:01 PM  LOS: 2 days

## 2015-02-14 ENCOUNTER — Encounter (HOSPITAL_COMMUNITY): Payer: Self-pay | Admitting: Internal Medicine

## 2015-02-14 DIAGNOSIS — F17210 Nicotine dependence, cigarettes, uncomplicated: Secondary | ICD-10-CM | POA: Diagnosis present

## 2015-02-14 DIAGNOSIS — K219 Gastro-esophageal reflux disease without esophagitis: Secondary | ICD-10-CM | POA: Diagnosis present

## 2015-02-14 DIAGNOSIS — B20 Human immunodeficiency virus [HIV] disease: Secondary | ICD-10-CM | POA: Diagnosis present

## 2015-02-14 DIAGNOSIS — J189 Pneumonia, unspecified organism: Principal | ICD-10-CM

## 2015-02-14 DIAGNOSIS — Z21 Asymptomatic human immunodeficiency virus [HIV] infection status: Secondary | ICD-10-CM

## 2015-02-14 LAB — BASIC METABOLIC PANEL
ANION GAP: 8 (ref 5–15)
BUN: 12 mg/dL (ref 6–20)
CALCIUM: 8.2 mg/dL — AB (ref 8.9–10.3)
CO2: 24 mmol/L (ref 22–32)
Chloride: 106 mmol/L (ref 101–111)
Creatinine, Ser: 0.96 mg/dL (ref 0.61–1.24)
GFR calc Af Amer: >60 mL/min (ref >60)
GLUCOSE: 95 mg/dL (ref 65–99)
Potassium: 3 mmol/L — ABNORMAL LOW (ref 3.5–5.1)
Sodium: 138 mmol/L (ref 135–145)

## 2015-02-14 LAB — T-HELPER CELLS (CD4) COUNT (NOT AT ARMC)
CD4 % Helper T Cell: 33 % (ref 33–55)
CD4 T Cell Abs: 650 /uL (ref 400–2700)

## 2015-02-14 LAB — GC/CHLAMYDIA PROBE AMP (~~LOC~~) NOT AT ARMC
Chlamydia: NEGATIVE
Neisseria Gonorrhea: NEGATIVE

## 2015-02-14 LAB — CBC
HEMATOCRIT: 29.2 % — AB (ref 39.0–52.0)
Hemoglobin: 9.8 g/dL — ABNORMAL LOW (ref 13.0–17.0)
MCH: 31.4 pg (ref 26.0–34.0)
MCHC: 33.6 g/dL (ref 30.0–36.0)
MCV: 93.6 fL (ref 78.0–100.0)
PLATELETS: 319 10*3/uL (ref 150–400)
RBC: 3.12 MIL/uL — ABNORMAL LOW (ref 4.22–5.81)
RDW: 14.1 % (ref 11.5–15.5)
WBC: 9.6 10*3/uL (ref 4.0–10.5)

## 2015-02-14 LAB — RPR: RPR Ser Ql: NONREACTIVE

## 2015-02-14 LAB — MAGNESIUM: MAGNESIUM: 1.8 mg/dL (ref 1.7–2.4)

## 2015-02-14 MED ORDER — ALUM & MAG HYDROXIDE-SIMETH 200-200-20 MG/5ML PO SUSP: 30.0000 mL | Freq: Four times a day (QID) | ORAL | Status: DC | PRN

## 2015-02-14 MED ORDER — POTASSIUM CHLORIDE CRYS ER 20 MEQ PO TBCR
40.0000 meq | EXTENDED_RELEASE_TABLET | Freq: Once | ORAL | Status: AC
  Administered 2015-02-14: 40 meq via ORAL
  Filled 2015-02-14: qty 2

## 2015-02-14 MED ORDER — AZITHROMYCIN 250 MG PO TABS: 500.0000 mg | ORAL_TABLET | Freq: Every day | ORAL | Status: DC

## 2015-02-14 MED ORDER — POTASSIUM CHLORIDE 10 MEQ/100ML IV SOLN
10.0000 meq | INTRAVENOUS | Status: AC
  Administered 2015-02-14 (×2): 10 meq via INTRAVENOUS
  Filled 2015-02-14 (×2): qty 100

## 2015-02-14 MED ORDER — OXYCODONE HCL 5 MG PO TABS: 5.0000 mg | ORAL_TABLET | ORAL | Status: DC | PRN

## 2015-02-14 MED ORDER — PANTOPRAZOLE SODIUM 40 MG PO TBEC: 40.0000 mg | DELAYED_RELEASE_TABLET | Freq: Two times a day (BID) | ORAL | Status: DC

## 2015-02-14 MED ORDER — NICOTINE 14 MG/24HR TD PT24: 14.0000 mg | MEDICATED_PATCH | Freq: Every day | TRANSDERMAL | Status: DC

## 2015-02-14 NOTE — Progress Notes (Signed)
Nursing Discharge Summary  Patient ID: Carl Schultz MRN: [id number] DOB/AGE: Nov 10, 1970 44 y.o.  Admit date: 02/11/2015 Discharge date: 02/14/2015  Discharged Condition: good  Disposition: 01-Home or Self Care  Follow-up Information    Follow up with Cliffton Asters, MD In 1 week.   Specialty:  Infectious Diseases   Why:  1.   Wednesday, 02/19/2015 at 11:15   Contact information:   [address] [place] [address] [phone]       Prescriptions Given: Discussed follow up appointments and medications with patient.  Patient given prescriptions for Protonix, Maalox, Nicotine patch, Oxycodone, and Zithromax.  Patient and wife at bedside both verbalized understanding without further questions.   Means of Discharge: Patient to be taken downstairs to be discharged home via private vehicle.   Signed: Gloriajean Dell 02/14/2015, 2:59 PM

## 2015-02-14 NOTE — Progress Notes (Signed)
Paged NP Lenny Pastel about K+ 3.0 this am.

## 2015-02-14 NOTE — Consult Note (Signed)
[facility]    Date of Admission:  02/11/2015           Day 4 azithromycin        Day 4 ceftriaxone       Reason for Consult: Positive HIV antibody and recurrent community-acquired pneumonia    Referring Physician: Dr. Hartley Barefoot  Principal Problem:   Recurrent pneumonia Active Problems:   HIV antibody positive   AKI (acute kidney injury)   GERD (gastroesophageal reflux disease)   Cigarette smoker   . azithromycin  500 mg Oral Daily  . cefTRIAXone (ROCEPHIN)  IV  1 g Intravenous Q24H  . fluticasone  1 spray Each Nare Daily  . nicotine  14 mg Transdermal Daily  . pantoprazole (PROTONIX) IV  40 mg Intravenous Q12H    Recommendations: 1. Discharge home on azithromycin 500 mg daily for 3 more days  2. He will follow-up with me on Wednesday, 02/19/2015 at 11:15  Assessment: He is responding well to empiric therapy for recurrent community-acquired pneumonia. I would recommend discharge home today and complete therapy with azithromycin 500 mg daily for 3 more days. I informed him of the initial positive HIV antibody. CD4 count and HIV viral load have been ordered. I will have him follow-up with me next week to review those results and determine if he needs anti-retroviral therapy. I offered assistance talking with his wife if he would like. She can also have free HIV testing in our clinic.    HPI: Carl Schultz is a 44 y.o. male who was admitted recently with recurrent pneumonia. HIV antibody screen was obtained and is positive. He has been treated with empiric azithromycin and ceftriaxone and says that he is 90% improved. He has only minimal dry cough now and no shortness of breath when walking in the hall. He has had pneumonia 5 times previously in the last 10 years. Other than that he has been in good health. He does smoke cigarettes.  He does not believe that he has ever been tested for HIV before but then notes that he did donate blood  at work 3 years ago never received any notification of any problems. He has been married for 7 years and believes it has been mutually monogamous. Prior to that he has had too numerous to count male sexual contacts in his lifetime. He has a remote history of gonorrhea.   Review of Systems: Constitutional: positive for chills and fevers, negative for anorexia, sweats and weight loss Eyes: negative Ears, nose, mouth, throat, and face: negative Respiratory: positive for asthma, cough and dyspnea on exertion, negative for pleurisy/chest pain and sputum Cardiovascular: negative Gastrointestinal: negative Genitourinary:negative Integument/breast: negative Musculoskeletal:positive for myalgias, negative for arthralgias and muscle weakness  Past Medical History  Diagnosis Date  . Asthma     childhood  . Pneumonia   . GERD (gastroesophageal reflux disease)     Social History  Substance Use Topics  . Smoking status: Current Every Day Smoker -- 0.50 packs/day    Types: Cigarettes  . Smokeless tobacco: None  . Alcohol Use: 0.0 oz/week    0 Standard drinks or equivalent per week     Comment: occ    Family History  Problem Relation Age of Onset  . Hypertension Mother   . Hypertension Father   . Diabetes Father    No Known Allergies  OBJECTIVE: Blood pressure 135/67, pulse 88, temperature 98.8 F (37.1 C), temperature  source Oral, resp. rate 16, height  (1.905 m), weight 199 lb 3.2 oz (90.357 kg), SpO2 97 %. General: He is alert and in no distress Skin: No rash Lymph nodes: No palpable adenopathy Oral: No thrush or other oropharyngeal lesions Lungs: Clear Cor: Regular S1 and S2 with no murmurs Abdomen: Soft and nontender without palpable masses Joints and extremities: Normal  Lab Results Lab Results  Component Value Date   WBC 9.6 02/14/2015   HGB 9.8* 02/14/2015   HCT 29.2* 02/14/2015   MCV 93.6 02/14/2015   PLT 319 02/14/2015    Lab Results  Component Value  Date   CREATININE 0.96 02/14/2015   BUN 12 02/14/2015   NA 138 02/14/2015   K 3.0* 02/14/2015   CL 106 02/14/2015   CO2 24 02/14/2015    Lab Results  Component Value Date   ALT 33 02/11/2015   AST 42* 02/11/2015   ALKPHOS 61 02/11/2015   BILITOT 1.1 02/11/2015     Microbiology: Recent Results (from the past 240 hour(s))  Culture, blood (routine x 2) Call MD if unable to obtain prior to antibiotics being given     Status: None (Preliminary result)   Collection Time: 02/12/15 12:15 AM  Result Value Ref Range Status   Specimen Description BLOOD RIGHT HAND  Final   Special Requests BOTTLES DRAWN AEROBIC ONLY 1CC  Final   Culture   Final    NO GROWTH 1 DAY Performed at [facility]    Report Status PENDING  Incomplete  Culture, blood (routine x 2) Call MD if unable to obtain prior to antibiotics being given     Status: None (Preliminary result)   Collection Time: 02/12/15 12:15 AM  Result Value Ref Range Status   Specimen Description BLOOD RIGHT ARM  Final   Special Requests BOTTLES DRAWN AEROBIC AND ANAEROBIC 8CC  Final   Culture   Final    NO GROWTH 1 DAY Performed at [facility]    Report Status PENDING  Incomplete  Culture, Urine     Status: None   Collection Time: 02/12/15  9:52 AM  Result Value Ref Range Status   Specimen Description URINE, CLEAN CATCH  Final   Special Requests NONE  Final   Culture   Final    NO GROWTH 1 DAY Performed at [facility]    Report Status 02/13/2015 FINAL  Final    Cliffton Asters, MD [facility] [phone] pager   [phone] cell 02/14/2015, 1:07 PM

## 2015-02-14 NOTE — Progress Notes (Addendum)
Placed sample cup in bathroom to collect sample of urine for the GC/Chlamydia probe amp (spoke to ED Charge RN that said that the MD has to do it if use swab/probe but a urine sample could also be sent (confirmed with lab)). Told patient that needed a urine sample that Dr Daiva Eves from ID had ordered. Mentioned to patient that Dr from ID would come see him tomorrow about test results being positive for issues concerning his immune system after patient said no one had told him about a ID Consult. Patient reports that he has had PNA several times this year.

## 2015-02-14 NOTE — Discharge Summary (Signed)
Physician Discharge Summary  Carl Schultz WUJ:[id number] DOB: Jul 17, 1970 DOA: 02/11/2015  PCP: Pcp Not In System  Admit date: 02/11/2015 Discharge date: 02/14/2015  Time spent: 35 minutes  Recommendations for Outpatient Follow-up:  1. Needs to follow up with [facility] for consideration to start antiretroviral therapy\\ 2. Follow up HIV viral load.   Discharge Diagnoses:    Recurrent pneumonia   AKI (acute kidney injury)   HIV antibody positive   GERD (gastroesophageal reflux disease)   Cigarette smoker   Discharge Condition: Stable.   Diet recommendation: Regular diet.   Filed Weights   02/11/15 2259  Weight: 90.357 kg (199 lb 3.2 oz)    History of present illness:  Carl Schultz is a 44 y.o. male  5 days ago patient developed right sided mid back pain. Seen in the ED 4 days ago and diagnosed with musculoskeletal/chest wall pain and given Motrin 800 and Robaxin. Patient states he has had some relief with these medicines but only for a limited amount of time. 3 days ago patient developed chills, subjective fevers, body aches, and shortness of breath with exertion. Patient states that symptoms continue to worsen and this feels like "my typical pneumonia. "Patient denies any substernal chest pain, diaphoresis, neck stiffness, confusion. He does have an associated headache which is now almost nearly resolved in the ED. Patient also states that he has some lower right chest pain that is nonradiating and sometimes worsened with deep breathing. Intermittent cough without sputum.  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  Hospital Course:  1-PNA; Community acquired.  Continue with ceftriaxone and Azithromycin day 4 legionella antigen negative, and blood culture.  He will be discharge on Azithromycin for 3 more days.  Appreciate Dr Orvan Falconer Help.   HIV  antibody positive; follow up with ID made.   2-AKI; in setting NSAID, dehydration.  continue with IV fluids.  Strict I and O.  Resolved with IV fluids.   3-Hematemesis; suspect mallory Weis tear.  protonix BID. Follow hb trend. Avoid ibuprofen.  Maalox for indigestion.  No evidence of active bleeding.   4-Headaches; improved. Feels sinus headaches pressure. Started flonase.   5-Hypokalemia; replete with oral KCL.    Procedures:  none  Consultations:  [facility]  Discharge Exam: Filed Vitals:   02/14/15 0519  BP: 135/67  Pulse: 88  Temp: 98.8 F (37.1 C)  Resp: 16    General: NAD Cardiovascular: S 1, S 2 RRR Respiratory: CTA  Discharge Instructions   Discharge Instructions    Diet general    Complete by:  As directed      Increase activity slowly    Complete by:  As directed           Current Discharge Medication List    START taking these medications   Details  alum & mag hydroxide-simeth (MAALOX/MYLANTA) 200-200-20 MG/5ML suspension Take 30 mLs by mouth every 6 (six) hours as needed for indigestion or heartburn. Qty: 355 mL, Refills: 0    azithromycin (ZITHROMAX) 250 MG tablet Take 2 tablets (500 mg total) by mouth daily. Qty: 3 each, Refills: 0    nicotine (NICODERM CQ - DOSED IN MG/24 HOURS) 14 mg/24hr patch Place 1 patch (14 mg total) onto the skin daily. Qty: 28 patch, Refills: 0    oxyCODONE (OXY IR/ROXICODONE) 5 MG immediate release  tablet Take 1 tablet (5 mg total) by mouth every 4 (four) hours as needed for moderate pain. Qty: 30 tablet, Refills: 0    pantoprazole (PROTONIX) 40 MG tablet Take 1 tablet (40 mg total) by mouth 2 (two) times daily. Qty: 60 tablet, Refills: 0      CONTINUE these medications which have NOT CHANGED   Details  methocarbamol (ROBAXIN) 500 MG tablet Take 1 tablet (500 mg total) by mouth 2 (two) times daily. Qty: 20 tablet, Refills: 0      STOP taking these medications     ibuprofen (ADVIL,MOTRIN) 200 MG  tablet      ibuprofen (ADVIL,MOTRIN) 800 MG tablet        No Known Allergies Follow-up Information    Follow up with Cliffton Asters, MD In 1 week.   Specialty:  Infectious Diseases   Why:  1.   Wednesday, 02/19/2015 at 11:15   Contact information:   [address] [place] [address] [phone]        The results of significant diagnostics from this hospitalization (including imaging, microbiology, ancillary and laboratory) are listed below for reference.    Significant Diagnostic Studies: Dg Chest 2 View  02/11/2015   CLINICAL DATA:  Cough, fever and chills for 5 days.  EXAM: CHEST  2 VIEW  COMPARISON:  February 07, 2015.  FINDINGS: The heart size and mediastinal contours are within normal limits. There is patchy pneumonia of right lung base. Minor atelectasis of left lung base is identified. There is no pulmonary edema. The visualized skeletal structures are unremarkable.  IMPRESSION: Right lung base pneumonia.   Electronically Signed   By: Sherian Rein M.D.   On: 02/11/2015 19:53   Dg Chest 2 View  02/07/2015   CLINICAL DATA:  Patient complains of right sided pain, patient states "the pain in my right shoulder blade started about 3 nights ago, but now it's more in my right chest area (radiating through chest)." Hx of "respiratory issues" per patient -- asthma, and pneumonia X 6. Current every day smoker.  EXAM: CHEST  2 VIEW  COMPARISON:  09/07/2011  FINDINGS: There is linear and reticular opacity in both lung bases, right greater than left, most likely atelectasis.  No lung consolidation or edema. No pleural effusion or pneumothorax.  Cardiac silhouette is normal in size. No mediastinal or hilar masses or evidence of adenopathy.  Skeletal structures are unremarkable.  IMPRESSION: 1. Lung base opacity most consistent with atelectasis. No convincing pneumonia. No pulmonary edema.   Electronically Signed   By: Amie Portland M.D.   On: 02/07/2015 19:54     Microbiology: Recent Results (from the past 240 hour(s))  Culture, blood (routine x 2) Call MD if unable to obtain prior to antibiotics being given     Status: None (Preliminary result)   Collection Time: 02/12/15 12:15 AM  Result Value Ref Range Status   Specimen Description BLOOD RIGHT HAND  Final   Special Requests BOTTLES DRAWN AEROBIC ONLY 1CC  Final   Culture   Final    NO GROWTH 1 DAY Performed at [facility]    Report Status PENDING  Incomplete  Culture, blood (routine x 2) Call MD if unable to obtain prior to antibiotics being given     Status: None (Preliminary result)   Collection Time: 02/12/15 12:15 AM  Result Value Ref Range Status   Specimen Description BLOOD RIGHT ARM  Final   Special Requests BOTTLES DRAWN AEROBIC AND  ANAEROBIC 8CC  Final   Culture   Final    NO GROWTH 1 DAY Performed at [facility]    Report Status PENDING  Incomplete  Culture, Urine     Status: None   Collection Time: 02/12/15  9:52 AM  Result Value Ref Range Status   Specimen Description URINE, CLEAN CATCH  Final   Special Requests NONE  Final   Culture   Final    NO GROWTH 1 DAY Performed at [facility]    Report Status 02/13/2015 FINAL  Final     Labs: Basic Metabolic Panel:  Recent Labs Lab 02/11/15 1910 02/12/15 0015 02/13/15 0406 02/14/15 0456  NA 135 135 137 138  K 3.8 3.1* 3.2* 3.0*  CL 102 107 106 106  CO2 26 21* 25 24  GLUCOSE 126* 121* 107* 95  BUN 26* 25* 11 12  CREATININE 1.98* 1.75* 0.99 0.96  CALCIUM 8.5* 7.7* 8.1* 8.2*  MG  --   --   --  1.8   Liver Function Tests:  Recent Labs Lab 02/11/15 1910  AST 42*  ALT 33  ALKPHOS 61  BILITOT 1.1  PROT 8.5*  ALBUMIN 3.1*   No results for input(s): LIPASE, AMYLASE in the last 168 hours. No results for input(s): AMMONIA in the last 168 hours. CBC:  Recent Labs Lab 02/11/15 1910 02/12/15 0910 02/13/15 0406 02/14/15 0456  WBC 15.3* 15.6* 15.5* 9.6  NEUTROABS 12.3*  --   --    --   HGB 11.7* 11.1* 11.3* 9.8*  HCT 33.5* 32.4* 33.2* 29.2*  MCV 93.3 93.6 93.5 93.6  PLT 203 236 298 319   Cardiac Enzymes:  Recent Labs Lab 02/11/15 1910  CKTOTAL 157   BNP: BNP (last 3 results) No results for input(s): BNP in the last 8760 hours.  ProBNP (last 3 results) No results for input(s): PROBNP in the last 8760 hours.  CBG: No results for input(s): GLUCAP in the last 168 hours.     Signed:  Hartley Barefoot A  [facility] 02/14/2015, 1:24 PM

## 2015-02-16 LAB — HEPATITIS PANEL, ACUTE
HCV Ab: 0.1 s/co ratio (ref 0.0–0.9)
HEP A IGM: NEGATIVE
HEP B C IGM: NEGATIVE
HEP B S AG: NEGATIVE

## 2015-02-17 LAB — CULTURE, BLOOD (ROUTINE X 2)
Culture: NO GROWTH
Culture: NO GROWTH

## 2015-02-17 LAB — GLUCOSE 6 PHOSPHATE DEHYDROGENASE
G6PDH: 2 U/g{Hb} — ABNORMAL LOW (ref 4.6–13.5)
HEMOGLOBIN: 10.1 g/dL — AB (ref 12.6–17.7)

## 2015-02-19 ENCOUNTER — Encounter: Payer: Self-pay | Admitting: *Deleted

## 2015-02-19 ENCOUNTER — Ambulatory Visit (INDEPENDENT_AMBULATORY_CARE_PROVIDER_SITE_OTHER): Payer: 59 | Admitting: Internal Medicine

## 2015-02-19 ENCOUNTER — Encounter: Payer: Self-pay | Admitting: Internal Medicine

## 2015-02-19 VITALS — BP 151/92 | HR 88 | Temp 98.3°F | Ht 75.0 in | Wt 192.2 lb

## 2015-02-19 DIAGNOSIS — Z21 Asymptomatic human immunodeficiency virus [HIV] infection status: Secondary | ICD-10-CM

## 2015-02-19 DIAGNOSIS — F329 Major depressive disorder, single episode, unspecified: Secondary | ICD-10-CM | POA: Diagnosis not present

## 2015-02-19 DIAGNOSIS — J189 Pneumonia, unspecified organism: Secondary | ICD-10-CM

## 2015-02-19 DIAGNOSIS — R1310 Dysphagia, unspecified: Secondary | ICD-10-CM | POA: Diagnosis not present

## 2015-02-19 DIAGNOSIS — F32A Depression, unspecified: Secondary | ICD-10-CM

## 2015-02-19 NOTE — Assessment & Plan Note (Signed)
I strongly suspect that his positive HIV antibody is a true positive. His HIV viral load is pending. His CD4 count is normal. I began the process of education about living with HIV infection. He will return in one week to consider starting Genvoya. I've asked him to have his wife, Crystal, come in for free partner testing.

## 2015-02-19 NOTE — Assessment & Plan Note (Signed)
He has an acute exacerbation of GERD complicated by dysphasia with solids. I will increase the dose of his lansoprazole. If his dysphasia does not resolved promptly I will arrange a GI evaluation.

## 2015-02-19 NOTE — Progress Notes (Signed)
Patient ID: Carl Schultz, male   DOB: 18-Feb-1971, 44 y.o.   MRN: [id number]          Patient Active Problem List   Diagnosis Date Noted  . HIV antibody positive 02/14/2015    Priority: High  . Recurrent pneumonia 02/11/2015    Priority: High  . Dysphagia 02/19/2015  . Depression 02/19/2015  . GERD (gastroesophageal reflux disease) 02/14/2015  . Cigarette smoker 02/14/2015    Patient's Medications  New Prescriptions   No medications on file  Previous Medications   LANSOPRAZOLE (PREVACID) 30 MG CAPSULE    Take 30 mg by mouth every morning.  Modified Medications   No medications on file  Discontinued Medications   ALUM & MAG HYDROXIDE-SIMETH (MAALOX/MYLANTA) 200-200-20 MG/5ML SUSPENSION    Take 30 mLs by mouth every 6 (six) hours as needed for indigestion or heartburn.   AZITHROMYCIN (ZITHROMAX) 250 MG TABLET    Take 2 tablets (500 mg total) by mouth daily.   LANSOPRAZOLE (PREVACID) 15 MG CAPSULE    Take 15 mg by mouth daily at 12 noon.   METHOCARBAMOL (ROBAXIN) 500 MG TABLET    Take 1 tablet (500 mg total) by mouth 2 (two) times daily.   NICOTINE (NICODERM CQ - DOSED IN MG/24 HOURS) 14 MG/24HR PATCH    Place 1 patch (14 mg total) onto the skin daily.   OXYCODONE (OXY IR/ROXICODONE) 5 MG IMMEDIATE RELEASE TABLET    Take 1 tablet (5 mg total) by mouth every 4 (four) hours as needed for moderate pain.   PANTOPRAZOLE (PROTONIX) 40 MG TABLET    Take 1 tablet (40 mg total) by mouth 2 (two) times daily.    Subjective: Carl Schultz is in for his hospital follow-up visit. He was hospitalized last week with recurrent community-acquired pneumonia. During that admission he was found to be HIV antibody positive. He does not recall ever being tested before. He is currently in a mutually monogamous marriage but prior to his marriage 7 years ago he had "too numerous to count" male partners. He has no history of blood transfusions, injecting drug use or male partners. Since learning about his positive  test he has shared that information with his wife, Crystal, and she has been very supportive. She has not been tested yet.  He states that he has been in a fog since he learned about his test result. He is sleeping well but has been somewhat depressed. He has 2 daughters. One is an adult. The other is 42 years old and living with her grandmother. The grandmother tries to keep Rahsaan from seen her. Joban states that this problem and the new diagnosis of HIV have made him feel somewhat out of control. He is sleeping more than normal.  He has had problems with acid reflux on and off for many years but it is much worse since he was hospitalized. He's been bothered by intractable hiccups and over the past week has had dysphasia with solid foods. He did not feel like Protonix was helping so he switch to lansoprazole yesterday.  Review of Systems: Constitutional: negative for anorexia, chills, fevers, sweats and weight loss Eyes: negative Ears, nose, mouth, throat, and face: negative Respiratory: positive for cough, dyspnea on exertion and sputum, negative for asthma, hemoptysis and pleurisy/chest pain Cardiovascular: negative Gastrointestinal: positive for diarrhea, dyspepsia and dysphagia, negative for abdominal pain, nausea, odynophagia and vomiting Genitourinary:negative Behavioral/Psych: positive for depression, negative for anxiety, illegal drug usage and loss of interest in favorite activities  Past Medical History  Diagnosis Date  . Asthma     childhood  . Pneumonia   . GERD (gastroesophageal reflux disease)     Social History  Substance Use Topics  . Smoking status: Current Every Day Smoker -- 0.50 packs/day    Types: Cigarettes  . Smokeless tobacco: Not on file  . Alcohol Use: 0.0 oz/week    0 Standard drinks or equivalent per week     Comment: occ    Family History  Problem Relation Age of Onset  . Hypertension Mother   . Hypertension Father   . Diabetes Father     No  Known Allergies  Objective:  Filed Vitals:   02/19/15 1131 02/19/15 1143  BP: 151/80 151/92  Pulse: 87 88  Temp: 98.3 F (36.8 C)   TempSrc: Oral   Height: 6\' 3"  (1.905 m)   Weight: 192 lb 4 oz (87.204 kg)    Body mass index is 24.03 kg/(m^2).    General: He is uncomfortable due to intractable hiccups but otherwise in no distress Oral: No oropharyngeal lesions Skin: No rash Lungs: Clear Cor: Regular S1 and S2 with no murmurs Abdomen: Soft and nontender Joints and extremities: Normal Neuro: Alert with normal speech and conversation Mood: He does not appear anxious or depressed  Lab Results Lab Results  Component Value Date   WBC 9.6 02/14/2015   HGB 9.8* 02/14/2015   HCT 29.2* 02/14/2015   MCV 93.6 02/14/2015   PLT 319 02/14/2015    Lab Results  Component Value Date   CREATININE 0.96 02/14/2015   BUN 12 02/14/2015   NA 138 02/14/2015   K 3.0* 02/14/2015   CL 106 02/14/2015   CO2 24 02/14/2015    Lab Results  Component Value Date   ALT 33 02/11/2015   AST 42* 02/11/2015   ALKPHOS 61 02/11/2015   BILITOT 1.1 02/11/2015    No results found for: CHOL, HDL, LDLCALC, LDLDIRECT, TRIG, CHOLHDL  Lab Results CD4 T CELL ABS (/uL)  Date Value  02/13/2015 650     Problem List Items Addressed This Visit      High   HIV antibody positive    I strongly suspect that his positive HIV antibody is a true positive. His HIV viral load is pending. His CD4 count is normal. I began the process of education about living with HIV infection. He will return in one week to consider starting Genvoya. I've asked him to have his wife, Crystal, come in for free partner testing.      Recurrent pneumonia    His community acquired pneumonia has resolved.        Unprioritized   Depression    He has mild depression. Fortunately, he has good support from his wife. I will introduce him to one of our behavioral health counselors at the time of his visit next week.      Dysphagia -  Primary    He has an acute exacerbation of GERD complicated by dysphasia with solids. I will increase the dose of his lansoprazole. If his dysphasia does not resolved promptly I will arrange a GI evaluation.           Cliffton Asters, MD [facility] [phone] pager   [phone] cell 02/19/2015, 12:41 PM

## 2015-02-19 NOTE — Assessment & Plan Note (Signed)
He has mild depression. Fortunately, he has good support from his wife. I will introduce him to one of our behavioral health counselors at the time of his visit next week.

## 2015-02-19 NOTE — Assessment & Plan Note (Signed)
His community acquired pneumonia has resolved.

## 2015-02-23 LAB — HIV-1 RNA ULTRAQUANT REFLEX TO GENTYP+
HIV-1 RNA BY PCR: 41340 {copies}/mL
HIV-1 RNA QUANT, LOG: 4.616 {Log_copies}/mL

## 2015-02-23 LAB — REFLEX TO GENOSURE(R) MG: HIV GENOSURE(R) MG PDF: 0

## 2015-02-24 LAB — HLA B*5701: HLA B 5701: NEGATIVE

## 2015-02-25 ENCOUNTER — Encounter: Payer: Self-pay | Admitting: Internal Medicine

## 2015-02-25 ENCOUNTER — Ambulatory Visit (INDEPENDENT_AMBULATORY_CARE_PROVIDER_SITE_OTHER): Payer: 59 | Admitting: Internal Medicine

## 2015-02-25 VITALS — BP 120/71 | HR 90 | Temp 98.4°F | Wt 187.0 lb

## 2015-02-25 DIAGNOSIS — Z21 Asymptomatic human immunodeficiency virus [HIV] infection status: Secondary | ICD-10-CM | POA: Diagnosis not present

## 2015-02-25 DIAGNOSIS — B20 Human immunodeficiency virus [HIV] disease: Secondary | ICD-10-CM

## 2015-02-25 DIAGNOSIS — F17210 Nicotine dependence, cigarettes, uncomplicated: Secondary | ICD-10-CM

## 2015-02-25 DIAGNOSIS — Z23 Encounter for immunization: Secondary | ICD-10-CM | POA: Diagnosis not present

## 2015-02-25 DIAGNOSIS — Z720 Tobacco use: Secondary | ICD-10-CM | POA: Diagnosis not present

## 2015-02-25 MED ORDER — ELVITEG-COBIC-EMTRICIT-TENOFAF 150-150-200-10 MG PO TABS: 1.0000 | ORAL_TABLET | Freq: Every day | ORAL | Status: DC

## 2015-02-25 NOTE — Assessment & Plan Note (Signed)
He states that he is only smoking about 3 cigarettes daily. I encouraged him to establish a quit date and try to stop completely.

## 2015-02-25 NOTE — Progress Notes (Signed)
Patient ID: Carl Schultz, male   DOB: September 25, 1970, 44 y.o.   MRN: [id number]          Patient Active Problem List   Diagnosis Date Noted  . HIV disease 02/14/2015    Priority: High  . Recurrent pneumonia 02/11/2015    Priority: High  . Dysphagia 02/19/2015  . Depression 02/19/2015  . GERD (gastroesophageal reflux disease) 02/14/2015  . Cigarette smoker 02/14/2015    Patient's Medications  New Prescriptions   ELVITEGRAVIR-COBICISTAT-EMTRICITABINE-TENOFOVIR (GENVOYA) 150-150-200-10 MG TABS TABLET    Take 1 tablet by mouth daily with breakfast.  Previous Medications   LANSOPRAZOLE (PREVACID) 30 MG CAPSULE    Take 30 mg by mouth every morning.  Modified Medications   No medications on file  Discontinued Medications   No medications on file    Subjective: Carl Schultz is in for his routine HIV follow-up visit. Fortunately his severe acid reflux and dysphasia resolved shortly after his visit with me last week. He still has some dyspnea on exertion but is feeling better. He has only minimal cough occasionally productive of white sputum. He's not had any fever, chills or sweats. He feels he is ready to return to light duty at work.   Review of Systems: Pertinent items are noted in HPI.  Past Medical History  Diagnosis Date  . Asthma     childhood  . Pneumonia   . GERD (gastroesophageal reflux disease)     Social History  Substance Use Topics  . Smoking status: Current Every Day Smoker -- 0.40 packs/day    Types: Cigarettes  . Smokeless tobacco: None  . Alcohol Use: 0.0 oz/week    0 Standard drinks or equivalent per week     Comment: occ    Family History  Problem Relation Age of Onset  . Hypertension Mother   . GER disease Mother   . Fibromyalgia Mother   . Hypertension Father   . Diabetes Father     No Known Allergies  Objective:  Filed Vitals:   02/25/15 1129  BP: 120/71  Pulse: 90  Temp: 98.4 F (36.9 C)  TempSrc: Oral  Weight: 187 lb (84.823 kg)   Body  mass index is 23.37 kg/(m^2).  General: His weight is down 13 pounds in the last 3 weeks Oral: No oropharyngeal lesions. Tongue stud present Skin: No rash Lungs: Clear Cor: Regular S1 and S2 with no murmurs Mood: Normal. He does not appear anxious or depressed  Lab Results Lab Results  Component Value Date   WBC 9.6 02/14/2015   HGB 9.8* 02/14/2015   HCT 29.2* 02/14/2015   MCV 93.6 02/14/2015   PLT 319 02/14/2015    Lab Results  Component Value Date   CREATININE 0.96 02/14/2015   BUN 12 02/14/2015   NA 138 02/14/2015   K 3.0* 02/14/2015   CL 106 02/14/2015   CO2 24 02/14/2015    Lab Results  Component Value Date   ALT 33 02/11/2015   AST 42* 02/11/2015   ALKPHOS 61 02/11/2015   BILITOT 1.1 02/11/2015    No results found for: CHOL, HDL, LDLCALC, LDLDIRECT, TRIG, CHOLHDL  Lab Results CD4 T CELL ABS (/uL)  Date Value  02/13/2015 650     Problem List Items Addressed This Visit      High   HIV disease - Primary    He has newly diagnosed HIV infection. Fortunately his CD4 count is normal. I talked to him again about how to interpret his CD4 count  and HIV viral load. He is ready to start on Genvoya. I will have him take it each evening with his dinner meal after he gets off of work. I talked to him about the importance of not missing doses. I asked him to set his cell phone alarm to remind him and to ask his wife to also give him reminders. I asked him to have his wife come in for free HIV testing. He will follow-up with me in one month.      Relevant Medications   elvitegravir-cobicistat-emtricitabine-tenofovir (GENVOYA) 150-150-200-10 MG TABS tablet     Unprioritized   Cigarette smoker    He states that he is only smoking about 3 cigarettes daily. I encouraged him to establish a quit date and try to stop completely.           Cliffton Asters, MD [facility] [phone] pager   [phone] cell 02/25/2015, 12:09  PM

## 2015-02-25 NOTE — Assessment & Plan Note (Signed)
He has newly diagnosed HIV infection. Fortunately his CD4 count is normal. I talked to him again about how to interpret his CD4 count and HIV viral load. He is ready to start on Genvoya. I will have him take it each evening with his dinner meal after he gets off of work. I talked to him about the importance of not missing doses. I asked him to set his cell phone alarm to remind him and to ask his wife to also give him reminders. I asked him to have his wife come in for free HIV testing. He will follow-up with me in one month.

## 2015-03-29 ENCOUNTER — Encounter (HOSPITAL_COMMUNITY): Payer: Self-pay | Admitting: Emergency Medicine

## 2015-03-29 ENCOUNTER — Emergency Department (HOSPITAL_COMMUNITY)
Admission: EM | Admit: 2015-03-29 | Discharge: 2015-03-29 | Disposition: A | Discharge status: Home / Self Care | Payer: 59 | Attending: Emergency Medicine | Admitting: Emergency Medicine

## 2015-03-29 DIAGNOSIS — Z720 Tobacco use: Secondary | ICD-10-CM | POA: Diagnosis not present

## 2015-03-29 DIAGNOSIS — Z79899 Other long term (current) drug therapy: Secondary | ICD-10-CM | POA: Diagnosis not present

## 2015-03-29 DIAGNOSIS — Z8701 Personal history of pneumonia (recurrent): Secondary | ICD-10-CM | POA: Diagnosis not present

## 2015-03-29 DIAGNOSIS — L0231 Cutaneous abscess of buttock: Secondary | ICD-10-CM | POA: Diagnosis not present

## 2015-03-29 DIAGNOSIS — K219 Gastro-esophageal reflux disease without esophagitis: Secondary | ICD-10-CM | POA: Diagnosis not present

## 2015-03-29 DIAGNOSIS — J45909 Unspecified asthma, uncomplicated: Secondary | ICD-10-CM | POA: Diagnosis not present

## 2015-03-29 MED ORDER — SULFAMETHOXAZOLE-TRIMETHOPRIM 800-160 MG PO TABS
1.0000 | ORAL_TABLET | Freq: Once | ORAL | Status: AC
  Administered 2015-03-29: 1 via ORAL
  Filled 2015-03-29: qty 1

## 2015-03-29 MED ORDER — CEPHALEXIN 500 MG PO CAPS
500.0000 mg | ORAL_CAPSULE | Freq: Once | ORAL | Status: AC
  Administered 2015-03-29: 500 mg via ORAL
  Filled 2015-03-29: qty 1

## 2015-03-29 MED ORDER — SULFAMETHOXAZOLE-TRIMETHOPRIM 800-160 MG PO TABS: 1.0000 | ORAL_TABLET | Freq: Two times a day (BID) | ORAL | Status: AC

## 2015-03-29 MED ORDER — HYDROCODONE-ACETAMINOPHEN 5-325 MG PO TABS: 1.0000 | ORAL_TABLET | ORAL | Status: DC | PRN

## 2015-03-29 MED ORDER — OXYCODONE-ACETAMINOPHEN 5-325 MG PO TABS
1.0000 | ORAL_TABLET | Freq: Once | ORAL | Status: AC
Start: 2015-03-29 — End: 2015-03-29
  Administered 2015-03-29: 1 via ORAL
  Filled 2015-03-29: qty 1

## 2015-03-29 MED ORDER — CEPHALEXIN 500 MG PO CAPS: 500.0000 mg | ORAL_CAPSULE | Freq: Four times a day (QID) | ORAL | Status: DC

## 2015-03-29 NOTE — ED Provider Notes (Signed)
CSN: [id number]     Arrival date & time 03/29/15  0243 History   First MD Initiated Contact with Patient 03/29/15 [phone]     Chief Complaint  Patient presents with  . Abscess    buttocks     (Consider location/radiation/quality/duration/timing/severity/associated sxs/prior Treatment) Patient is a 44 y.o. male presenting with abscess. The history is provided by the patient. No language interpreter was used.  Abscess Location:  Ano-genital Ano-genital abscess location:  R buttock Abscess quality: not draining   Associated symptoms: no fever   Associated symptoms comment:  Painful area to right medial buttock over several days, getting worse. No fever. No drainage. No history of previous abscess.    Past Medical History  Diagnosis Date  . Asthma     childhood  . Pneumonia   . GERD (gastroesophageal reflux disease)    History reviewed. No pertinent past surgical history. Family History  Problem Relation Age of Onset  . Hypertension Mother   . GER disease Mother   . Fibromyalgia Mother   . Hypertension Father   . Diabetes Father    Social History  Substance Use Topics  . Smoking status: Current Some Day Smoker -- 0.40 packs/day    Types: Cigarettes  . Smokeless tobacco: None  . Alcohol Use: 0.0 oz/week    0 Standard drinks or equivalent per week     Comment: occ    Review of Systems  Constitutional: Negative for fever and chills.  Genitourinary: Negative for scrotal swelling.  Musculoskeletal: Negative.   Skin: Positive for wound.  Neurological: Negative.       Allergies  Review of patient's allergies indicates no known allergies.  Home Medications   Prior to Admission medications   Medication Sig Start Date End Date Taking? Authorizing Provider  elvitegravir-cobicistat-emtricitabine-tenofovir (GENVOYA) 150-150-200-10 MG TABS tablet Take 1 tablet by mouth daily with breakfast. 02/25/15   Cliffton Asters, MD  lansoprazole (PREVACID) 30 MG capsule Take 30 mg by  mouth every morning.    Historical Provider, MD   BP 115/65 mmHg  Pulse 93  Temp(Src) 98.4 F (36.9 C) (Oral)  Resp 18  Ht  (1.905 m)  Wt 190 lb (86.183 kg)  BMI 23.75 kg/m2  SpO2 96% Physical Exam  Constitutional: He is oriented to person, place, and time. He appears well-developed and well-nourished.  Neck: Normal range of motion.  Pulmonary/Chest: Effort normal.  Genitourinary:  Large fluctuant area of tender swelling right gluteal cleft with induration that tracks toward/near anus.   Musculoskeletal: Normal range of motion.  Neurological: He is alert and oriented to person, place, and time.  Skin: Skin is warm and dry.  Psychiatric: He has a normal mood and affect.    ED Course  Procedures (including critical care time) Labs Review Labs Reviewed - No data to display  Imaging Review No results found. I have personally reviewed and evaluated these images and lab results as part of my medical decision-making.   EKG Interpretation None     INCISION AND DRAINAGE Performed by: Elpidio Anis A Consent: Verbal consent obtained. Risks and benefits: risks, benefits and alternatives were discussed Type: abscess  Body area: right gluteal fold  Anesthesia: local infiltration  Incision was made with a scalpel.  Local anesthetic: lidocaine 2% w/epinephrine  Anesthetic total: 2 ml  Complexity: complex Blunt dissection to break up loculations  Drainage: purulent  Drainage amount: large  Packing material: 1/4" iodoform   Patient tolerance: Patient tolerated the procedure well with no immediate  complications.    MDM   Final diagnoses:  None    1. Gluteal abscess  Patient is evaluated by Dr. Fayrene FearingJames. I&D performed as above. 2-day recheck recommended. Will start antibiotics as wife has similar symptoms of abscess.     Elpidio AnisShari Jeramiah Mccaughey, PA-C 03/29/15 0432  Elpidio AnisShari Surie Suchocki, PA-C 03/29/15 [id number]  Rolland PorterMark James, MD 03/29/15 2312

## 2015-03-29 NOTE — ED Notes (Signed)
Patient states he has an abscess to his buttocks, states it appeared 2-3 days ago. Patient's wife has a bump to the side of her face, they were concerned they were related occurences.

## 2015-03-29 NOTE — ED Provider Notes (Signed)
Pt seen and evaluated.  Hx of gluteal pain for several days.  PE shows right sided, medial gluteal cleft abscess that has induration approaching the anus.  Agree with I&D.  Rolland PorterMark Silus Lanzo, MD 03/29/15 [phone]

## 2015-03-29 NOTE — Discharge Instructions (Signed)
Abscess °An abscess is an infected area that contains a collection of pus and debris. It can occur in almost any part of the body. An abscess is also known as a furuncle or boil. °CAUSES  °An abscess occurs when tissue gets infected. This can occur from blockage of oil or sweat glands, infection of hair follicles, or a minor injury to the skin. As the body tries to fight the infection, pus collects in the area and creates pressure under the skin. This pressure causes pain. People with weakened immune systems have difficulty fighting infections and get certain abscesses more often.  °SYMPTOMS °Usually an abscess develops on the skin and becomes a painful mass that is red, warm, and tender. If the abscess forms under the skin, you may feel a moveable soft area under the skin. Some abscesses break open (rupture) on their own, but most will continue to get worse without care. The infection can spread deeper into the body and eventually into the bloodstream, causing you to feel ill.  °DIAGNOSIS  °Your caregiver will take your medical history and perform a physical exam. A sample of fluid may also be taken from the abscess to determine what is causing your infection. °TREATMENT  °Your caregiver may prescribe antibiotic medicines to fight the infection. However, taking antibiotics alone usually does not cure an abscess. Your caregiver may need to make a small cut (incision) in the abscess to drain the pus. In some cases, gauze is packed into the abscess to reduce pain and to continue draining the area. °HOME CARE INSTRUCTIONS  °· Only take over-the-counter or prescription medicines for pain, discomfort, or fever as directed by your caregiver. °· If you were prescribed antibiotics, take them as directed. Finish them even if you start to feel better. °· If gauze is used, follow your caregiver's directions for changing the gauze. °· To avoid spreading the infection: °· Keep your draining abscess covered with a  bandage. °· Wash your hands well. °· Do not share personal care items, towels, or whirlpools with others. °· Avoid skin contact with others. °· Keep your skin and clothes clean around the abscess. °· Keep all follow-up appointments as directed by your caregiver. °SEEK MEDICAL CARE IF:  °· You have increased pain, swelling, redness, fluid drainage, or bleeding. °· You have muscle aches, chills, or a general ill feeling. °· You have a fever. °MAKE SURE YOU:  °· Understand these instructions. °· Will watch your condition. °· Will get help right away if you are not doing well or get worse. °  °This information is not intended to replace advice given to you by your health care provider. Make sure you discuss any questions you have with your health care provider. °  °Document Released: 03/10/2005 Document Revised: 11/30/2011 Document Reviewed: 08/13/2011 °Elsevier Interactive Patient Education ©2016 [employer] Inc[employer] ° °Incision and Drainage °Incision and drainage is a procedure in which a sac-like structure (cystic structure) is opened and drained. The area to be drained usually contains material such as pus, fluid, or blood.  °LET YOUR CAREGIVER KNOW ABOUT:  °· Allergies to medicine. °· Medicines taken, including vitamins, herbs, eyedrops, over-the-counter medicines, and creams. °· Use of steroids (by mouth or creams). °· Previous problems with anesthetics or numbing medicines. °· History of bleeding problems or blood clots. °· Previous surgery. °· Other health problems, including diabetes and kidney problems. °· Possibility of pregnancy, if this applies. °RISKS AND COMPLICATIONS °· Pain. °· Bleeding. °· Scarring. °· Infection. °BEFORE THE PROCEDURE  °  You may need to have an ultrasound or other imaging tests to see how large or deep your cystic structure is. Blood tests may also be used to determine if you have an infection or how severe the infection is. You may need to have a tetanus shot. °PROCEDURE  °The affected area  is cleaned with a cleaning fluid. The cyst area will then be numbed with a medicine (local anesthetic). A small incision will be made in the cystic structure. A syringe or catheter may be used to drain the contents of the cystic structure, or the contents may be squeezed out. The area will then be flushed with a cleansing solution. After cleansing the area, it is often gently packed with a gauze or another wound dressing. Once it is packed, it will be covered with gauze and tape or some other type of wound dressing.  °AFTER THE PROCEDURE  °· Often, you will be allowed to go home right after the procedure. °· You may be given antibiotic medicine to prevent or heal an infection. °· If the area was packed with gauze or some other wound dressing, you will likely need to come back in 1 to 2 days to get it removed. °· The area should heal in about 14 days. °  °This information is not intended to replace advice given to you by your health care provider. Make sure you discuss any questions you have with your health care provider. °  °Document Released: 11/24/2000 Document Revised: 11/30/2011 Document Reviewed: 07/26/2011 °Elsevier Interactive Patient Education ©2016 [employer] Inc[employer] °Heat Therapy °Heat therapy can help ease sore, stiff, injured, and tight muscles and joints. Heat relaxes your muscles, which may help ease your pain.  °RISKS AND COMPLICATIONS °If you have any of the following conditions, do not use heat therapy unless your health care provider has approved: °· Poor circulation. °· Healing wounds or scarred skin in the area being treated. °· Diabetes, heart disease, or high blood pressure. °· Not being able to feel (numbness) the area being treated. °· Unusual swelling of the area being treated. °· Active infections. °· Blood clots. °· Cancer. °· Inability to communicate pain. This may include young children and people who have problems with their brain function (dementia). °· Pregnancy. °Heat therapy should only  be used on old, pre-existing, or long-lasting (chronic) injuries. Do not use heat therapy on new injuries unless directed by your health care provider. °HOW TO USE HEAT THERAPY °There are several different kinds of heat therapy, including: °· Moist heat pack. °· Warm water bath. °· Hot water bottle. °· Electric heating pad. °· Heated gel pack. °· Heated wrap. °· Electric heating pad. °Use the heat therapy method suggested by your health care provider. Follow your health care provider's instructions on when and how to use heat therapy. °GENERAL HEAT THERAPY RECOMMENDATIONS °· Do not sleep while using heat therapy. Only use heat therapy while you are awake. °· Your skin may turn pink while using heat therapy. Do not use heat therapy if your skin turns red. °· Do not use heat therapy if you have new pain. °· High heat or long exposure to heat can cause burns. Be careful when using heat therapy to avoid burning your skin. °· Do not use heat therapy on areas of your skin that are already irritated, such as with a rash or sunburn. °SEEK MEDICAL CARE IF: °· You have blisters, redness, swelling, or numbness. °· You have new pain. °· Your pain is worse. °MAKE SURE   YOU: °· Understand these instructions. °· Will watch your condition. °· Will get help right away if you are not doing well or get worse. °  °This information is not intended to replace advice given to you by your health care provider. Make sure you discuss any questions you have with your health care provider. °  °Document Released: 08/23/2011 Document Revised: 06/21/2014 Document Reviewed: 07/24/2013 °Elsevier Interactive Patient Education ©2016 Elsevier Inc. ° °

## 2015-03-29 NOTE — ED Notes (Signed)
Bed: WTR8 Expected date:  Expected time:  Means of arrival:  Comments: 

## 2015-03-31 ENCOUNTER — Encounter: Payer: Self-pay | Admitting: Internal Medicine

## 2015-03-31 ENCOUNTER — Ambulatory Visit (INDEPENDENT_AMBULATORY_CARE_PROVIDER_SITE_OTHER): Payer: 59 | Admitting: Internal Medicine

## 2015-03-31 VITALS — BP 138/75 | HR 96 | Temp 98.3°F | Ht 75.0 in | Wt 182.0 lb

## 2015-03-31 DIAGNOSIS — B20 Human immunodeficiency virus [HIV] disease: Secondary | ICD-10-CM | POA: Diagnosis not present

## 2015-03-31 DIAGNOSIS — L0232 Furuncle of buttock: Secondary | ICD-10-CM

## 2015-03-31 NOTE — Assessment & Plan Note (Signed)
His boil is improving after her incision and drainage. Since I have no culture data to guide therapy I will have him continue cephalexin and trimethoprim sulfamethoxazole for 4 more days.

## 2015-03-31 NOTE — Assessment & Plan Note (Signed)
He is just getting started on Genvoya. I encouraged him to try not to miss any doses. He will continue to take it with his evening meal. He will follow-up here for repeat blood work in 6 weeks. His wife will be offered free rapid HIV testing today.

## 2015-03-31 NOTE — Progress Notes (Addendum)
Patient ID: Carl Schultz, male   DOB: 1971-02-22, 44 y.o.   MRN: [id number]          Patient Active Problem List   Diagnosis Date Noted  . HIV disease (HCC) 02/14/2015    Priority: High  . Recurrent pneumonia 02/11/2015    Priority: High  . Boil of buttock 03/31/2015  . Dysphagia 02/19/2015  . Depression 02/19/2015  . GERD (gastroesophageal reflux disease) 02/14/2015  . Cigarette smoker 02/14/2015    Patient's Medications  New Prescriptions   No medications on file  Previous Medications   CEPHALEXIN (KEFLEX) 500 MG CAPSULE    Take 1 capsule (500 mg total) by mouth 4 (four) times daily.   ELVITEGRAVIR-COBICISTAT-EMTRICITABINE-TENOFOVIR (GENVOYA) 150-150-200-10 MG TABS TABLET    Take 1 tablet by mouth daily with breakfast.   HYDROCODONE-ACETAMINOPHEN (NORCO/VICODIN) 5-325 MG TABLET    Take 1-2 tablets by mouth every 4 (four) hours as needed.   LANSOPRAZOLE (PREVACID) 30 MG CAPSULE    Take 30 mg by mouth every morning.   SULFAMETHOXAZOLE-TRIMETHOPRIM (BACTRIM DS,SEPTRA DS) 800-160 MG TABLET    Take 1 tablet by mouth 2 (two) times daily.  Modified Medications   No medications on file  Discontinued Medications   No medications on file    Subjective: Carl Schultz is in for his routine HIV follow-up visit. 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 He took his first dose of Genvoya with dinner last night. He did not have any problems tolerating it. He continues to smoke cigarettes and has no current plans to quit. His wife is aware of his HIV infection and fully supportive. She is with him today and is interested in HIV testing. He recently developed a boil on his right buttock and was seen and the [facility] emergency department 2 days ago. The boil was lanced. No cultures were obtained. He was started on cephalexin and trimethoprim sulfamethoxazole and is feeling better. He has not had  any fever, chills or sweats.   Review of Systems: Pertinent items are noted in HPI.  Past Medical History  Diagnosis Date  . Asthma     childhood  . Pneumonia   . GERD (gastroesophageal reflux disease)     Social History  Substance Use Topics  . Smoking status: Current Some Day Smoker -- 0.40 packs/day    Types: Cigarettes  . Smokeless tobacco: Never Used  . Alcohol Use: 0.0 oz/week    0 Standard drinks or equivalent per week     Comment: occ    Family History  Problem Relation Age of Onset  . Hypertension Mother   . GER disease Mother   . Fibromyalgia Mother   . Hypertension Father   . Diabetes Father     No Known Allergies  Objective:  Filed Vitals:   03/31/15 1535  BP: 138/75  Pulse: 96  Temp: 98.3 F (36.8 C)  TempSrc: Oral  Height:  (1.905 m)  Weight: 182 lb (82.555 kg)   Body mass index is 22.75 kg/(m^2).  General: He is smiling and in good spirits Oral: No oropharyngeal lesions Skin: No rash Lungs: Clear Cor: Regular S1 and S2 with no murmurs Buttock: There is some dark yellow drainage on his gauze dressing taped between his buttocks. On the dressing was removed it was difficult to even see the area where the boil was lanced. There was  no expressible drainage. There was no tenderness.  Lab Results Lab Results  Component Value Date   WBC 9.6 02/14/2015   HGB 9.8* 02/14/2015   HCT 29.2* 02/14/2015   MCV 93.6 02/14/2015   PLT 319 02/14/2015    Lab Results  Component Value Date   CREATININE 0.96 02/14/2015   BUN 12 02/14/2015   NA 138 02/14/2015   K 3.0* 02/14/2015   CL 106 02/14/2015   CO2 24 02/14/2015    Lab Results  Component Value Date   ALT 33 02/11/2015   AST 42* 02/11/2015   ALKPHOS 61 02/11/2015   BILITOT 1.1 02/11/2015    No results found for: CHOL, HDL, LDLCALC, LDLDIRECT, TRIG, CHOLHDL  Lab Results CD4 T CELL ABS (/uL)  Date Value  02/13/2015 650     Problem List Items Addressed This Visit      High   HIV  disease (HCC) - Primary    He is just getting started on Genvoya. I encouraged him to try not to miss any doses. He will continue to take it with his evening meal. He will follow-up here for repeat blood work in 6 weeks. His wife will be offered free rapid HIV testing today.        Unprioritized   Boil of buttock    His boil is improving after her incision and drainage. Since I have no culture data to guide therapy I will have him continue cephalexin and trimethoprim sulfamethoxazole for 4 more days.           Cliffton AstersJohn Zyshawn Bohnenkamp, MD [facility] [phone] pager   [phone] cell 03/31/2015, 4:06 PM  Addendum: I received this communication from Carl Morrowammy King, RN        According to [facility] Berna SpareMarcus has been A known positive since 2008. They have documentation of his initial meeting. I was told this when I was giving information on his spouse.

## 2015-05-12 ENCOUNTER — Encounter: Payer: Self-pay | Admitting: Internal Medicine

## 2015-05-12 ENCOUNTER — Ambulatory Visit (INDEPENDENT_AMBULATORY_CARE_PROVIDER_SITE_OTHER): Payer: Self-pay | Admitting: Internal Medicine

## 2015-05-12 VITALS — BP 143/89 | HR 92 | Temp 98.7°F | Wt 189.0 lb

## 2015-05-12 DIAGNOSIS — B20 Human immunodeficiency virus [HIV] disease: Secondary | ICD-10-CM

## 2015-05-12 DIAGNOSIS — F32A Depression, unspecified: Secondary | ICD-10-CM

## 2015-05-12 DIAGNOSIS — N489 Disorder of penis, unspecified: Secondary | ICD-10-CM | POA: Insufficient documentation

## 2015-05-12 DIAGNOSIS — N4889 Other specified disorders of penis: Secondary | ICD-10-CM

## 2015-05-12 DIAGNOSIS — F329 Major depressive disorder, single episode, unspecified: Secondary | ICD-10-CM

## 2015-05-12 NOTE — Progress Notes (Signed)
Patient Active Problem List   Diagnosis Date Noted  . HIV disease (HCC) 02/14/2015    Priority: High  . Recurrent pneumonia 02/11/2015    Priority: High  . Penile lesion 05/12/2015  . Boil of buttock 03/31/2015  . Dysphagia 02/19/2015  . Depression 02/19/2015  . GERD (gastroesophageal reflux disease) 02/14/2015  . Cigarette smoker 02/14/2015    Patient's Medications  New Prescriptions   No medications on file  Previous Medications   ELVITEGRAVIR-COBICISTAT-EMTRICITABINE-TENOFOVIR (GENVOYA) 150-150-200-10 MG TABS TABLET    Take 1 tablet by mouth daily with breakfast.   LANSOPRAZOLE (PREVACID) 30 MG CAPSULE    Take 30 mg by mouth every morning.  Modified Medications   No medications on file  Discontinued Medications   CEPHALEXIN (KEFLEX) 500 MG CAPSULE    Take 1 capsule (500 mg total) by mouth 4 (four) times daily.   HYDROCODONE-ACETAMINOPHEN (NORCO/VICODIN) 5-325 MG TABLET    Take 1-2 tablets by mouth every 4 (four) hours as needed.    Subjective: Carl Schultz is in with his wife, Crystal, for his routine HIV follow-up. Unfortunately he recently lost his job after he tested positive for marijuana on a urine drug screen. This caused him to lose his insurance and he was never able to get started on Genvoya. He has been unsuccessful so far and finding a new job. Has resulted her finances have suffered and he had to borrow money from his mother today to get gas so he comes to this appointment. He's also been bothered by some irritation on the shaft of his penis. Has been there for about 2 weeks. He has never had this before. Crystal is also had some vaginal burning recently.  Since he lost his job he has been feeling depressed. He has had difficulty staying asleep, often waking around 2 AM. As a result he's had daytime fatigue. His appetite remains normal.   Review of Systems: Review of Systems  Constitutional: Positive for malaise/fatigue. Negative for fever, chills, weight  loss and diaphoresis.  HENT: Negative for sore throat.   Respiratory: Negative for cough, sputum production and shortness of breath.   Cardiovascular: Negative for chest pain.  Gastrointestinal: Positive for heartburn. Negative for nausea, vomiting and diarrhea.       His heartburn is much improved. It now occurs only intermittently and he can usually successfully treated by drinking some almond milk.  Genitourinary: Negative for dysuria and frequency.  Musculoskeletal: Negative for myalgias and joint pain.  Skin: Negative for rash.  Neurological: Negative for focal weakness.  Psychiatric/Behavioral: Positive for depression and substance abuse. The patient has insomnia. The patient is not nervous/anxious.     Past Medical History  Diagnosis Date  . Asthma     childhood  . Pneumonia   . GERD (gastroesophageal reflux disease)     Social History  Substance Use Topics  . Smoking status: Current Some Day Smoker -- 0.40 packs/day    Types: Cigarettes  . Smokeless tobacco: Never Used  . Alcohol Use: 0.0 oz/week    0 Standard drinks or equivalent per week     Comment: occ    Family History  Problem Relation Age of Onset  . Hypertension Mother   . GER disease Mother   . Fibromyalgia Mother   . Hypertension Father   . Diabetes Father     No Known Allergies  Objective:  Filed Vitals:   05/12/15 1550  BP: 143/89  Pulse: 92  Temp:  98.7 F (37.1 C)  TempSrc: Oral  Weight: 189 lb (85.73 kg)   Body mass index is 23.62 kg/(m^2).  Physical Exam  Constitutional: He is oriented to person, place, and time. No distress.  HENT:  Mouth/Throat: No oropharyngeal exudate.  Eyes: Conjunctivae are normal.  Cardiovascular: Normal rate and regular rhythm.   No murmur heard. Pulmonary/Chest: Breath sounds normal.  Abdominal: Soft. He exhibits no mass. There is no tenderness.  Genitourinary:  He has irritation, mild hyperpigmentation and some peeling on the shaft of his penis.    Musculoskeletal: Normal range of motion.  Neurological: He is alert and oriented to person, place, and time.  Skin: No rash noted.  Psychiatric: Affect normal.  He did become tearful when talking about his depression and the recent loss of his job.    Lab Results Lab Results  Component Value Date   WBC 9.6 02/14/2015   HGB 9.8* 02/14/2015   HCT 29.2* 02/14/2015   MCV 93.6 02/14/2015   PLT 319 02/14/2015    Lab Results  Component Value Date   CREATININE 0.96 02/14/2015   BUN 12 02/14/2015   NA 138 02/14/2015   K 3.0* 02/14/2015   CL 106 02/14/2015   CO2 24 02/14/2015    Lab Results  Component Value Date   ALT 33 02/11/2015   AST 42* 02/11/2015   ALKPHOS 61 02/11/2015   BILITOT 1.1 02/11/2015    No results found for: CHOL, HDL, LDLCALC, LDLDIRECT, TRIG, CHOLHDL  Lab Results CD4 T CELL ABS (/uL)  Date Value  02/13/2015 650      Problem List Items Addressed This Visit      High   HIV disease [facility](HCC)    He will meet with our pharmacist today and [facility] to [facility] while we are waiting for his ADAP to be improved. He will follow-up after lab work in 6 weeks.      Relevant Orders   T-helper cell (CD4)- ([facility] clinic only)   HIV 1 RNA quant-no reflex-bld   CBC   Comprehensive metabolic panel   Lipid panel   RPR     Unprioritized   Depression - Primary    His depression has worsened because of his recent job loss and inability to start his medication. I will make a referral for behavioral health counseling.      Penile lesion    His penile lesion is nonspecific. He may have candidal infection. I will treat him with fluconazole.           Cliffton AstersJohn Marvalene Barrett, MD [facility] [phone] pager   [phone] cell 05/12/2015, 4:25 PM

## 2015-05-12 NOTE — Assessment & Plan Note (Signed)
His depression has worsened because of his recent job loss and inability to start his medication. I will make a referral for behavioral health counseling.

## 2015-05-12 NOTE — Assessment & Plan Note (Signed)
His penile lesion is nonspecific. He may have candidal infection. I will treat him with fluconazole.

## 2015-05-12 NOTE — Assessment & Plan Note (Signed)
He will meet with our pharmacist today and [facility] to get [facility] while we are waiting for his ADAP to be improved. He will follow-up after lab work in 6 weeks.

## 2015-05-13 ENCOUNTER — Other Ambulatory Visit: Payer: Self-pay | Admitting: Internal Medicine

## 2015-05-13 DIAGNOSIS — Z21 Asymptomatic human immunodeficiency virus [HIV] infection status: Secondary | ICD-10-CM

## 2015-05-13 MED ORDER — ELVITEG-COBIC-EMTRICIT-TENOFAF 150-150-200-10 MG PO TABS: 1.0000 | ORAL_TABLET | Freq: Every day | ORAL | Status: DC

## 2015-06-03 ENCOUNTER — Telehealth: Payer: Self-pay

## 2015-06-03 NOTE — Telephone Encounter (Signed)
Called to set up appointment to Renew ADAP - pt has no Voicemail

## 2015-07-30 NOTE — Progress Notes (Signed)
Patient ID: Carl Schultz, male   DOB: [birth year], 45 y.o.   MRN: [id number]

## 2015-07-31 ENCOUNTER — Encounter: Payer: Self-pay | Admitting: Internal Medicine

## 2015-07-31 ENCOUNTER — Ambulatory Visit (INDEPENDENT_AMBULATORY_CARE_PROVIDER_SITE_OTHER): Payer: Self-pay | Admitting: Internal Medicine

## 2015-07-31 VITALS — BP 119/76 | HR 79 | Temp 97.7°F | Wt 182.0 lb

## 2015-07-31 DIAGNOSIS — Z21 Asymptomatic human immunodeficiency virus [HIV] infection status: Secondary | ICD-10-CM

## 2015-07-31 DIAGNOSIS — Z23 Encounter for immunization: Secondary | ICD-10-CM

## 2015-07-31 DIAGNOSIS — F329 Major depressive disorder, single episode, unspecified: Secondary | ICD-10-CM

## 2015-07-31 DIAGNOSIS — Z79899 Other long term (current) drug therapy: Secondary | ICD-10-CM

## 2015-07-31 DIAGNOSIS — Z720 Tobacco use: Secondary | ICD-10-CM

## 2015-07-31 DIAGNOSIS — B20 Human immunodeficiency virus [HIV] disease: Secondary | ICD-10-CM

## 2015-07-31 DIAGNOSIS — Z113 Encounter for screening for infections with a predominantly sexual mode of transmission: Secondary | ICD-10-CM

## 2015-07-31 DIAGNOSIS — F17210 Nicotine dependence, cigarettes, uncomplicated: Secondary | ICD-10-CM

## 2015-07-31 DIAGNOSIS — F32A Depression, unspecified: Secondary | ICD-10-CM

## 2015-07-31 LAB — CBC
HCT: 42.6 % (ref 39.0–52.0)
Hemoglobin: 14.4 g/dL (ref 13.0–17.0)
MCH: 33.1 pg (ref 26.0–34.0)
MCHC: 33.8 g/dL (ref 30.0–36.0)
MCV: 97.9 fL (ref 78.0–100.0)
MPV: 10.9 fL (ref 8.6–12.4)
PLATELETS: 263 10*3/uL (ref 150–400)
RBC: 4.35 MIL/uL (ref 4.22–5.81)
RDW: 13.8 % (ref 11.5–15.5)
WBC: 5 10*3/uL (ref 4.0–10.5)

## 2015-07-31 LAB — COMPREHENSIVE METABOLIC PANEL
ALT: 14 U/L (ref 9–46)
AST: 19 U/L (ref 10–40)
Albumin: 4.3 g/dL (ref 3.6–5.1)
Alkaline Phosphatase: 43 U/L (ref 40–115)
BUN: 14 mg/dL (ref 7–25)
CHLORIDE: 105 mmol/L (ref 98–110)
CO2: 26 mmol/L (ref 20–31)
Calcium: 9.6 mg/dL (ref 8.6–10.3)
Creat: 1.31 mg/dL (ref 0.60–1.35)
GLUCOSE: 88 mg/dL (ref 65–99)
POTASSIUM: 4.2 mmol/L (ref 3.5–5.3)
Sodium: 138 mmol/L (ref 135–146)
Total Bilirubin: 0.7 mg/dL (ref 0.2–1.2)
Total Protein: 8.4 g/dL — ABNORMAL HIGH (ref 6.1–8.1)

## 2015-07-31 LAB — LIPID PANEL
CHOL/HDL RATIO: 4.5 ratio (ref <=5.0)
CHOLESTEROL: 201 mg/dL — AB (ref 125–200)
HDL: 45 mg/dL (ref >=40)
LDL Cholesterol: 144 mg/dL — ABNORMAL HIGH (ref <130)
TRIGLYCERIDES: 59 mg/dL (ref <150)
VLDL: 12 mg/dL (ref <30)

## 2015-07-31 MED ORDER — ELVITEG-COBIC-EMTRICIT-TENOFAF 150-150-200-10 MG PO TABS: 1.0000 | ORAL_TABLET | Freq: Every day | ORAL | Status: DC

## 2015-07-31 NOTE — Assessment & Plan Note (Signed)
I talked to him at length again about the importance of keeping his ADAP certification current, taking his medication each day with a meal and doing his best to not miss any doses even if he chooses to drink alcohol. I will check his lab work today and have him meet with our financial counselor to try to get his ADAP reinstated as soon as possible. He will follow-up in one month.

## 2015-07-31 NOTE — Progress Notes (Signed)
Patient Active Problem List   Diagnosis Date Noted  . HIV disease (HCC) 02/14/2015    Priority: High  . Recurrent pneumonia 02/11/2015    Priority: High  . Penile lesion 05/12/2015  . Boil of buttock 03/31/2015  . Dysphagia 02/19/2015  . Depression 02/19/2015  . GERD (gastroesophageal reflux disease) 02/14/2015  . Cigarette smoker 02/14/2015    Patient's Medications  New Prescriptions   No medications on file  Previous Medications   ELVITEGRAVIR-COBICISTAT-EMTRICITABINE-TENOFOVIR (GENVOYA) 150-150-200-10 MG TABS TABLET    Take 1 tablet by mouth daily with breakfast.   LANSOPRAZOLE (PREVACID) 30 MG CAPSULE    Take 30 mg by mouth every morning.  Modified Medications   No medications on file  Discontinued Medications   No medications on file    Subjective: Carl Schultz is in for his routine HIV follow-up. His wife, Carl Schultz, is with him today. He states that when he woke up this morning he did not feel depressed like he had been. Unfortunately he let his ADAP lapse and ran out of Genvoya yesterday. Crystal has been sharing her genital area with him. He had been taking it each morning about 10 AM. He does not always take it with a meal. He states that he is down to 5 cigarettes daily. He has been thinking about trying to quit on his upcoming birthday on 08/11/2013. He states that he's also been taking Bactrim because Crystal takes it. I have never prescribed Bactrim for him because his CD4 count is normal.  Review of Systems: Review of Systems  Constitutional: Negative for fever, chills, weight loss, malaise/fatigue and diaphoresis.  HENT: Negative for sore throat.   Respiratory: Negative for cough, sputum production and shortness of breath.   Cardiovascular: Negative for chest pain.  Gastrointestinal: Negative for nausea, vomiting and diarrhea.  Genitourinary: Negative for dysuria.  Musculoskeletal: Negative for myalgias and joint pain.  Skin: Negative for rash.    Neurological: Negative for sensory change.  Psychiatric/Behavioral: Positive for depression and substance abuse. The patient is not nervous/anxious.        He has missed some doses of his medications when he was drinking alcohol.    Past Medical History  Diagnosis Date  . Asthma     childhood  . Pneumonia   . GERD (gastroesophageal reflux disease)     Social History  Substance Use Topics  . Smoking status: Current Some Day Smoker -- 0.40 packs/day    Types: Cigarettes  . Smokeless tobacco: Never Used  . Alcohol Use: 0.0 oz/week    0 Standard drinks or equivalent per week     Comment: occ    Family History  Problem Relation Age of Onset  . Hypertension Mother   . GER disease Mother   . Fibromyalgia Mother   . Hypertension Father   . Diabetes Father     No Known Allergies  Objective:  Filed Vitals:   07/31/15 0902  BP: 119/76  Pulse: 79  Temp: 97.7 F (36.5 C)  TempSrc: Oral  Weight: 182 lb (82.555 kg)   Body mass index is 22.75 kg/(m^2).  Physical Exam  Constitutional: He is oriented to person, place, and time.  He is smiling and in good spirits.  HENT:  Mouth/Throat: No oropharyngeal exudate.  Eyes: Conjunctivae are normal.  Cardiovascular: Normal rate and regular rhythm.   No murmur heard. Pulmonary/Chest: Breath sounds normal.  Abdominal: Soft. There is no tenderness.  Musculoskeletal: Normal range of motion.  Neurological: He is alert and oriented to person, place, and time.  Skin: No rash noted.  Psychiatric: Mood and affect normal.    Lab Results Lab Results  Component Value Date   WBC 9.6 02/14/2015   HGB 9.8* 02/14/2015   HCT 29.2* 02/14/2015   MCV 93.6 02/14/2015   PLT 319 02/14/2015    Lab Results  Component Value Date   CREATININE 0.96 02/14/2015   BUN 12 02/14/2015   NA 138 02/14/2015   K 3.0* 02/14/2015   CL 106 02/14/2015   CO2 24 02/14/2015    Lab Results  Component Value Date   ALT 33 02/11/2015   AST 42* 02/11/2015    ALKPHOS 61 02/11/2015   BILITOT 1.1 02/11/2015    No results found for: CHOL, HDL, LDLCALC, LDLDIRECT, TRIG, CHOLHDL  Lab Results CD4 T CELL ABS (/uL)  Date Value  02/13/2015 650      Problem List Items Addressed This Visit      High   HIV disease (HCC)    I talked to him at length again about the importance of keeping his ADAP certification current, taking his medication each day with a meal and doing his best to not miss any doses even if he chooses to drink alcohol. I will check his lab work today and have him meet with our financial counselor to try to get his ADAP reinstated as soon as possible. He will follow-up in one month.      Relevant Orders   T-helper cell (CD4)- ([facility] clinic only)   HIV 1 RNA quant-no reflex-bld   CBC   Comprehensive metabolic panel   RPR   Lipid panel     Unprioritized   Cigarette smoker    I encouraged him to go through with his plan to quit smoking on his upcoming birthday. I will see him back in one month to try to reinforce his commitment to quitting.      Depression    He is still struggling with situational depression. I believe that he tries to put on a good show for me. I talked him about the importance of eliminating his alcohol intake.       Other Visit Diagnoses    Need for prophylactic vaccination and inoculation against viral hepatitis    -  Primary    Relevant Orders    Hepatitis B vaccine adult IM    Hepatitis A vaccine adult IM         Cliffton Asters, MD [facility] [phone] pager   [phone] cell 07/31/2015, 9:24 AM

## 2015-07-31 NOTE — Assessment & Plan Note (Signed)
I encouraged him to go through with his plan to quit smoking on his upcoming birthday. I will see him back in one month to try to reinforce his commitment to quitting.

## 2015-07-31 NOTE — Assessment & Plan Note (Signed)
He is still struggling with situational depression. I believe that he tries to put on a good show for me. I talked him about the importance of eliminating his alcohol intake.

## 2015-08-01 LAB — T-HELPER CELL (CD4) - (RCID CLINIC ONLY)
CD4 T CELL ABS: 560 /uL (ref 400–2700)
CD4 T CELL HELPER: 28 % — AB (ref 33–55)

## 2015-08-01 LAB — HIV-1 RNA QUANT-NO REFLEX-BLD
HIV 1 RNA Quant: <20 copies/mL (ref <20)
HIV-1 RNA Quant, Log: <1.3 Log copies/mL (ref <1.30)

## 2015-08-01 LAB — RPR

## 2015-09-02 ENCOUNTER — Ambulatory Visit: Payer: Self-pay | Admitting: Internal Medicine

## 2015-09-02 ENCOUNTER — Telehealth: Payer: Self-pay | Admitting: *Deleted

## 2015-09-02 NOTE — Telephone Encounter (Signed)
Pt made a new appt.

## 2015-09-05 ENCOUNTER — Encounter: Payer: Self-pay | Admitting: Internal Medicine

## 2015-09-16 ENCOUNTER — Ambulatory Visit: Payer: Self-pay | Admitting: Internal Medicine

## 2015-09-22 ENCOUNTER — Other Ambulatory Visit: Payer: Self-pay | Admitting: *Deleted

## 2015-09-22 DIAGNOSIS — Z21 Asymptomatic human immunodeficiency virus [HIV] infection status: Secondary | ICD-10-CM

## 2015-09-22 MED ORDER — ELVITEG-COBIC-EMTRICIT-TENOFAF 150-150-200-10 MG PO TABS: 1.0000 | ORAL_TABLET | Freq: Every day | ORAL | Status: DC

## 2015-11-04 ENCOUNTER — Other Ambulatory Visit (INDEPENDENT_AMBULATORY_CARE_PROVIDER_SITE_OTHER): Payer: Self-pay

## 2015-11-04 ENCOUNTER — Ambulatory Visit: Payer: Self-pay | Admitting: Internal Medicine

## 2015-11-04 ENCOUNTER — Telehealth: Payer: Self-pay | Admitting: *Deleted

## 2015-11-04 DIAGNOSIS — Z113 Encounter for screening for infections with a predominantly sexual mode of transmission: Secondary | ICD-10-CM

## 2015-11-04 DIAGNOSIS — Z79899 Other long term (current) drug therapy: Secondary | ICD-10-CM

## 2015-11-04 DIAGNOSIS — B20 Human immunodeficiency virus [HIV] disease: Secondary | ICD-10-CM

## 2015-11-04 LAB — COMPREHENSIVE METABOLIC PANEL
ALK PHOS: 45 U/L (ref 40–115)
ALT: 9 U/L (ref 9–46)
AST: 13 U/L (ref 10–40)
Albumin: 4 g/dL (ref 3.6–5.1)
BUN: 14 mg/dL (ref 7–25)
CALCIUM: 9.1 mg/dL (ref 8.6–10.3)
CHLORIDE: 103 mmol/L (ref 98–110)
CO2: 27 mmol/L (ref 20–31)
Creat: 1.31 mg/dL (ref 0.60–1.35)
Glucose, Bld: 72 mg/dL (ref 65–99)
POTASSIUM: 4.1 mmol/L (ref 3.5–5.3)
Sodium: 137 mmol/L (ref 135–146)
TOTAL PROTEIN: 7.8 g/dL (ref 6.1–8.1)
Total Bilirubin: 0.4 mg/dL (ref 0.2–1.2)

## 2015-11-04 LAB — CBC
HEMATOCRIT: 42.3 % (ref 38.5–50.0)
Hemoglobin: 14.1 g/dL (ref 13.2–17.1)
MCH: 33.3 pg — AB (ref 27.0–33.0)
MCHC: 33.3 g/dL (ref 32.0–36.0)
MCV: 100 fL (ref 80.0–100.0)
MPV: 11.1 fL (ref 7.5–12.5)
PLATELETS: 236 10*3/uL (ref 140–400)
RBC: 4.23 MIL/uL (ref 4.20–5.80)
RDW: 13.1 % (ref 11.0–15.0)
WBC: 7.1 10*3/uL (ref 3.8–10.8)

## 2015-11-04 LAB — LIPID PANEL
CHOL/HDL RATIO: 4 ratio (ref <=5.0)
CHOLESTEROL: 194 mg/dL (ref 125–200)
HDL: 48 mg/dL (ref >=40)
LDL Cholesterol: 123 mg/dL (ref <130)
TRIGLYCERIDES: 113 mg/dL (ref <150)
VLDL: 23 mg/dL (ref <30)

## 2015-11-04 NOTE — Telephone Encounter (Signed)
Made new appts.  Pt forgot the appt.

## 2015-11-05 LAB — HIV-1 RNA QUANT-NO REFLEX-BLD: HIV-1 RNA Quant, Log: <1.3 Log copies/mL (ref <1.30)

## 2015-11-05 LAB — RPR

## 2015-11-06 LAB — T-HELPER CELL (CD4) - (RCID CLINIC ONLY)
CD4 T CELL ABS: 950 /uL (ref 400–2700)
CD4 T CELL HELPER: 41 % (ref 33–55)

## 2015-11-11 ENCOUNTER — Ambulatory Visit: Payer: Self-pay | Admitting: Internal Medicine

## 2015-12-24 ENCOUNTER — Emergency Department (HOSPITAL_COMMUNITY)
Admission: EM | Admit: 2015-12-24 | Discharge: 2015-12-24 | Disposition: A | Discharge status: Home / Self Care | Payer: Self-pay | Attending: Emergency Medicine | Admitting: Emergency Medicine

## 2015-12-24 ENCOUNTER — Encounter (HOSPITAL_COMMUNITY): Payer: Self-pay

## 2015-12-24 DIAGNOSIS — F17210 Nicotine dependence, cigarettes, uncomplicated: Secondary | ICD-10-CM | POA: Insufficient documentation

## 2015-12-24 DIAGNOSIS — M7918 Myalgia, other site: Secondary | ICD-10-CM

## 2015-12-24 DIAGNOSIS — J45909 Unspecified asthma, uncomplicated: Secondary | ICD-10-CM | POA: Insufficient documentation

## 2015-12-24 DIAGNOSIS — M25512 Pain in left shoulder: Secondary | ICD-10-CM | POA: Insufficient documentation

## 2015-12-24 MED ORDER — METHOCARBAMOL 500 MG PO TABS: 500.0000 mg | ORAL_TABLET | Freq: Two times a day (BID) | ORAL | Status: DC

## 2015-12-24 NOTE — ED Notes (Signed)
Patient able to ambulate independently  

## 2015-12-24 NOTE — ED Provider Notes (Signed)
CSN: [id number]     Arrival date & time 12/24/15  1712 History  By signing my name below, I, Renetta ChalkBobby Ross, attest that this documentation has been prepared under the direction and in the presence of Audry Piliyler Saranne Crislip PA-C.  Electronically Signed: Renetta ChalkBobby Ross, ED Scribe. 12/24/2015. 6:53 PM   Chief Complaint  Patient presents with  . Shoulder Pain  . Neck Pain   The history is provided by the patient. No language interpreter was used.   HPI Comments: Carl Schultz is a 45 y.o. male who presents to the Emergency Department complaining of intermittent, burning, left posterior shoulder pain onset one week ago. 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 Pt denies any pain currently and states that the pain subsides by the time he gets home from work. Pt states he used a "topical muscle relaxer cream" with no relief. Pt has also applied heat with no relief. Pt denies any recent shortness of breath. Pt denies any PCP.   Past Medical History  Diagnosis Date  . Asthma     childhood  . Pneumonia   . GERD (gastroesophageal reflux disease)    History reviewed. No pertinent past surgical history. Family History  Problem Relation Age of Onset  . Hypertension Mother   . GER disease Mother   . Fibromyalgia Mother   . Hypertension Father   . Diabetes Father    Social History  Substance Use Topics  . Smoking status: Current Some Day Smoker -- 0.40 packs/day    Types: Cigarettes  . Smokeless tobacco: Never Used  . Alcohol Use: 0.0 oz/week    0 Standard drinks or equivalent per week     Comment: occ    Review of Systems  Respiratory: Negative for shortness of breath.   Musculoskeletal: Positive for arthralgias (left posterior shoulder).  All other systems reviewed and are negative.  Allergies  Review of patient's allergies indicates no known allergies.  Home Medications   Prior to Admission medications   Medication Sig Start Date End Date  Taking? Authorizing Provider  elvitegravir-cobicistat-emtricitabine-tenofovir (GENVOYA) 150-150-200-10 MG TABS tablet Take 1 tablet by mouth daily with breakfast. 09/22/15   Cliffton AstersJohn Campbell, MD  lansoprazole (PREVACID) 30 MG capsule Take 30 mg by mouth every morning.    Historical Provider, MD   BP 134/98 mmHg  Pulse 87  Temp(Src) 98.4 F (36.9 C) (Oral)  Resp 18  SpO2 100%   Physical Exam  Constitutional: He is oriented to person, place, and time. He appears well-developed and well-nourished. No distress.  HENT:  Head: Normocephalic and atraumatic.  Eyes: EOM are normal.  Neck: Normal range of motion.  Cardiovascular: Normal rate and regular rhythm.   Pulmonary/Chest: Effort normal and breath sounds normal. He has no wheezes. He has no rhonchi. He has no rales.  Abdominal: Soft.  Musculoskeletal: Normal range of motion. He exhibits tenderness.  TTP on the left trapezius, no spinous process tenderness, no signs of infection or erythema, full range of motion, neurovascularly intact.   Neurological: He is alert and oriented to person, place, and time.  Skin: Skin is warm and dry. He is not diaphoretic.  Psychiatric: He has a normal mood and affect. His behavior is normal. Judgment and thought content normal.  Nursing note and vitals reviewed.   ED Course  Procedures  DIAGNOSTIC STUDIES: Oxygen Saturation is 100% on RA, normal by my interpretation.  COORDINATION OF CARE: 6:39  PM-Will order muscle relaxer and rest. Discussed treatment plan with pt at bedside and pt agreed to plan.   Labs Review Labs Reviewed - No data to display  Imaging Review No results found. I have personally reviewed and evaluated these images and lab results as part of my medical decision-making.   EKG Interpretation None      MDM  I have reviewed the relevant previous healthcare records. I obtained HPI from historian.  ED Course:  Assessment: Pt is a 45yM who presents with left trapezius pain.  Likely musculoskeletal in origin. On exam, pt in NAD. Nontoxic/nonseptic appearing. VSS. Afebrile. Lungs CTA. Heart RRR. TTP on left Trapezius. Likely musculoskeletal pain. Pain is reproducible on exam. Given Rx Robaxin. Plan is to DC home with follow up to PCP. At time of discharge, Patient is in no acute distress. Vital Signs are stable. Patient is able to ambulate. Patient able to tolerate PO.    Disposition/Plan:  DC home Additional Verbal discharge instructions given and discussed with patient.  Pt Instructed to f/u with PCP in the next week for evaluation and treatment of symptoms. Return precautions given Pt acknowledges and agrees with plan  Supervising Physician Lyndal Pulley, MD    Final diagnoses:  Musculoskeletal pain   I personally performed the services described in this documentation, which was scribed in my presence. The recorded information has been reviewed and is accurate.   Audry Pili, PA-C 12/24/15 2346  Lyndal Pulley, MD 12/25/15 [phone]

## 2015-12-24 NOTE — ED Notes (Signed)
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

## 2015-12-24 NOTE — Discharge Instructions (Signed)
Please read and follow all provided instructions.  Your diagnoses today include:  1. Musculoskeletal pain    Tests performed today include:  Vital signs. See below for your results today.   Medications prescribed:   Take as prescribed   Home care instructions:  Follow any educational materials contained in this packet.  Follow-up instructions: Please follow-up with your primary care provider for further evaluation of symptoms and treatment   *PRICE in the first 24-48 hours after injury: Protect (with brace, splint, sling), if given by your provider Rest Ice- Do not apply ice pack directly to your skin, place towel or similar between your skin and ice/ice pack. Apply ice for 20 min, then remove for 40 min while awake Compression- Wear brace, elastic bandage, splint as directed by your provider Elevate affected extremity above the level of your heart when not walking around for the first 24-48 hours   Use Ibuprofen (Motrin/Advil) 600mg  every 6 hours as needed for pain   Return instructions:   Please return to the Emergency Department if you do not get better, if you get worse, or new symptoms OR  - Fever (temperature greater than 101.51F)  - Bleeding that does not stop with holding pressure to the area    -Severe pain (please note that you may be more sore the day after your accident)  - Chest Pain  - Difficulty breathing  - Severe nausea or vomiting  - Inability to tolerate food and liquids  - Passing out  - Skin becoming red around your wounds  - Change in mental status (confusion or lethargy)  - New numbness or weakness     Please return if you have any other emergent concerns.  Additional Information:  Your vital signs today were: BP 134/98 mmHg   Pulse 87   Temp(Src) 98.4 F (36.9 C) (Oral)   Resp 18   SpO2 100% If your blood pressure (BP) was elevated above 135/85 this visit, please have this repeated by your doctor within one month. ---------------

## 2016-01-01 ENCOUNTER — Ambulatory Visit: Payer: Self-pay

## 2016-01-07 ENCOUNTER — Other Ambulatory Visit: Payer: Self-pay

## 2016-01-21 ENCOUNTER — Ambulatory Visit (INDEPENDENT_AMBULATORY_CARE_PROVIDER_SITE_OTHER): Payer: Self-pay | Admitting: Internal Medicine

## 2016-01-21 ENCOUNTER — Encounter: Payer: Self-pay | Admitting: Internal Medicine

## 2016-01-21 VITALS — BP 121/75 | HR 77 | Temp 97.8°F | Ht 75.0 in | Wt 205.0 lb

## 2016-01-21 DIAGNOSIS — F329 Major depressive disorder, single episode, unspecified: Secondary | ICD-10-CM

## 2016-01-21 DIAGNOSIS — F32A Depression, unspecified: Secondary | ICD-10-CM

## 2016-01-21 DIAGNOSIS — Z23 Encounter for immunization: Secondary | ICD-10-CM

## 2016-01-21 DIAGNOSIS — B20 Human immunodeficiency virus [HIV] disease: Secondary | ICD-10-CM

## 2016-01-21 DIAGNOSIS — F17210 Nicotine dependence, cigarettes, uncomplicated: Secondary | ICD-10-CM

## 2016-01-21 DIAGNOSIS — Z720 Tobacco use: Secondary | ICD-10-CM

## 2016-01-21 NOTE — Assessment & Plan Note (Signed)
He is not feeling depressed. He is in very good spirits and tells me that he feels like he has everything under much better control than he did last year.

## 2016-01-21 NOTE — Progress Notes (Signed)
Patient Active Problem List   Diagnosis Date Noted  . HIV disease (HCC) 02/14/2015    Priority: High  . Recurrent pneumonia 02/11/2015    Priority: High  . Penile lesion 05/12/2015  . Boil of buttock 03/31/2015  . Dysphagia 02/19/2015  . Depression 02/19/2015  . GERD (gastroesophageal reflux disease) 02/14/2015  . Cigarette smoker 02/14/2015    Patient's Medications  New Prescriptions   No medications on file  Previous Medications   ELVITEGRAVIR-COBICISTAT-EMTRICITABINE-TENOFOVIR (GENVOYA) 150-150-200-10 MG TABS TABLET    Take 1 tablet by mouth daily with breakfast.   METHOCARBAMOL (ROBAXIN) 500 MG TABLET    Take 1 tablet (500 mg total) by mouth 2 (two) times daily.  Modified Medications   No medications on file  Discontinued Medications   LANSOPRAZOLE (PREVACID) 30 MG CAPSULE    Take 30 mg by mouth every morning.    Subjective: Jeff is in for his routine HIV followup visit. He continues to take Genvoya. He takes it each day with a meal at the same time that his wife takes her medication. He does not recall missing any doses. He states that he is no longer feeling depressed. He doesn't like the fact that he does not have steady work. He has cut down significantly on his alcohol intake.  Review of Systems: Review of Systems  Constitutional: Negative for chills, diaphoresis, fever, malaise/fatigue and weight loss.  HENT: Negative for sore throat.   Respiratory: Negative for cough, sputum production and shortness of breath.   Cardiovascular: Negative for chest pain.  Gastrointestinal: Negative for diarrhea, nausea and vomiting.  Genitourinary: Negative for dysuria and frequency.  Musculoskeletal: Negative for joint pain and myalgias.  Skin: Negative for rash.  Neurological: Negative for dizziness and headaches.  Psychiatric/Behavioral: Negative for depression and substance abuse. The patient is not nervous/anxious.     Past Medical History:  Diagnosis Date    . Asthma    childhood  . GERD (gastroesophageal reflux disease)   . Pneumonia     Social History  Substance Use Topics  . Smoking status: Current Some Day Smoker    Packs/day: 0.40    Types: Cigarettes  . Smokeless tobacco: Never Used     Comment: ready to quit "soon"  . Alcohol use 0.0 oz/week     Comment: occ    Family History  Problem Relation Age of Onset  . Hypertension Mother   . GER disease Mother   . Fibromyalgia Mother   . Hypertension Father   . Diabetes Father     No Known Allergies  Objective:  Vitals:   01/21/16 1429  BP: 121/75  Pulse: 77  Temp: 97.8 F (36.6 C)  TempSrc: Oral  Weight: 205 lb (93 kg)  Height:  (1.905 m)   Body mass index is 25.62 kg/m.  Physical Exam  Constitutional: He is oriented to person, place, and time.  HENT:  Mouth/Throat: No oropharyngeal exudate.  Eyes: Conjunctivae are normal.  Cardiovascular: Normal rate and regular rhythm.   No murmur heard. Pulmonary/Chest: Breath sounds normal.  Abdominal: Soft. He exhibits no mass. There is no tenderness.  Musculoskeletal: Normal range of motion.  Neurological: He is alert and oriented to person, place, and time.  Skin: No rash noted.  Psychiatric: Mood and affect normal.    Lab Results Lab Results  Component Value Date   WBC 7.1 11/04/2015   HGB 14.1 11/04/2015   HCT 42.3 11/04/2015   MCV  100.0 11/04/2015   PLT 236 11/04/2015    Lab Results  Component Value Date   CREATININE 1.31 11/04/2015   BUN 14 11/04/2015   NA 137 11/04/2015   K 4.1 11/04/2015   CL 103 11/04/2015   CO2 27 11/04/2015    Lab Results  Component Value Date   ALT 9 11/04/2015   AST 13 11/04/2015   ALKPHOS 45 11/04/2015   BILITOT 0.4 11/04/2015    Lab Results  Component Value Date   CHOL 194 11/04/2015   HDL 48 11/04/2015   LDLCALC 123 11/04/2015   TRIG 113 11/04/2015   CHOLHDL 4.0 11/04/2015   HIV 1 RNA Quant (copies/mL)  Date Value  11/04/2015 <20  07/31/2015 <20    CD4 T Cell Abs (/uL)  Date Value  11/04/2015 950  07/31/2015 560  02/13/2015 650     Problem List Items Addressed This Visit      High   HIV disease (HCC)    His infection has come under excellent control. He will continue Genvoya and followup after lab work in 6 months.      Relevant Orders   T-helper cell (CD4)- ([facility] only)   HIV 1 RNA quant-no reflex-bld   CBC   Comprehensive metabolic panel   Lipid panel   RPR     Unprioritized   Cigarette smoker    I encouraged him to consider cigarette cessation.      Depression    He is not feeling depressed. He is in very good spirits and tells me that he feels like he has everything under much better control than he did last year.       Other Visit Diagnoses   None.       Cliffton AstersJohn Annelie Boak, MD [facility] [phone] pager   [phone] cell 01/21/2016, 3:04 PM

## 2016-01-21 NOTE — Addendum Note (Signed)
Addended by: Andree CossHOWELL, Tamla Winkels M on: 01/21/2016 03:29 PM   Modules accepted: Orders

## 2016-01-21 NOTE — Assessment & Plan Note (Signed)
I encouraged him to consider cigarette cessation. 

## 2016-01-21 NOTE — Assessment & Plan Note (Signed)
His infection has come under excellent control. He will continue Genvoya and followup after lab work in 6 months.

## 2016-02-12 ENCOUNTER — Encounter: Payer: Self-pay | Admitting: Internal Medicine

## 2016-03-14 ENCOUNTER — Other Ambulatory Visit: Payer: Self-pay | Admitting: Internal Medicine

## 2016-03-14 DIAGNOSIS — Z21 Asymptomatic human immunodeficiency virus [HIV] infection status: Secondary | ICD-10-CM

## 2016-04-07 ENCOUNTER — Other Ambulatory Visit: Payer: Self-pay | Admitting: Infectious Diseases

## 2016-04-07 DIAGNOSIS — Z21 Asymptomatic human immunodeficiency virus [HIV] infection status: Secondary | ICD-10-CM

## 2016-07-28 ENCOUNTER — Ambulatory Visit: Payer: Self-pay

## 2016-08-10 ENCOUNTER — Encounter: Payer: Self-pay | Admitting: Internal Medicine

## 2016-08-31 ENCOUNTER — Other Ambulatory Visit (INDEPENDENT_AMBULATORY_CARE_PROVIDER_SITE_OTHER): Payer: Self-pay

## 2016-08-31 DIAGNOSIS — B20 Human immunodeficiency virus [HIV] disease: Secondary | ICD-10-CM

## 2016-08-31 DIAGNOSIS — Z79899 Other long term (current) drug therapy: Secondary | ICD-10-CM

## 2016-08-31 DIAGNOSIS — Z113 Encounter for screening for infections with a predominantly sexual mode of transmission: Secondary | ICD-10-CM

## 2016-08-31 LAB — CBC
HCT: 42.2 % (ref 38.5–50.0)
Hemoglobin: 14.2 g/dL (ref 13.2–17.1)
MCH: 33.4 pg — AB (ref 27.0–33.0)
MCHC: 33.6 g/dL (ref 32.0–36.0)
MCV: 99.3 fL (ref 80.0–100.0)
MPV: 11.1 fL (ref 7.5–12.5)
PLATELETS: 222 10*3/uL (ref 140–400)
RBC: 4.25 MIL/uL (ref 4.20–5.80)
RDW: 13 % (ref 11.0–15.0)
WBC: 7.1 10*3/uL (ref 3.8–10.8)

## 2016-08-31 LAB — COMPREHENSIVE METABOLIC PANEL
ALT: 18 U/L (ref 9–46)
AST: 21 U/L (ref 10–40)
Albumin: 4.2 g/dL (ref 3.6–5.1)
Alkaline Phosphatase: 41 U/L (ref 40–115)
BUN: 12 mg/dL (ref 7–25)
CHLORIDE: 106 mmol/L (ref 98–110)
CO2: 26 mmol/L (ref 20–31)
Calcium: 9.4 mg/dL (ref 8.6–10.3)
Creat: 1.27 mg/dL (ref 0.60–1.35)
GLUCOSE: 90 mg/dL (ref 65–99)
POTASSIUM: 4 mmol/L (ref 3.5–5.3)
Sodium: 139 mmol/L (ref 135–146)
TOTAL PROTEIN: 7.7 g/dL (ref 6.1–8.1)
Total Bilirubin: 0.8 mg/dL (ref 0.2–1.2)

## 2016-08-31 LAB — LIPID PANEL
CHOL/HDL RATIO: 3.4 ratio (ref <5.0)
CHOLESTEROL: 179 mg/dL (ref <200)
HDL: 53 mg/dL (ref >40)
LDL CALC: 112 mg/dL — AB (ref <100)
Triglycerides: 69 mg/dL (ref <150)
VLDL: 14 mg/dL (ref <30)

## 2016-09-01 LAB — T-HELPER CELL (CD4) - (RCID CLINIC ONLY)
CD4 % Helper T Cell: 39 % (ref 33–55)
CD4 T CELL ABS: 940 /uL (ref 400–2700)

## 2016-09-01 LAB — RPR

## 2016-09-02 LAB — HIV-1 RNA QUANT-NO REFLEX-BLD
HIV 1 RNA QUANT: NOT DETECTED {copies}/mL
HIV-1 RNA Quant, Log: <1.3 Log copies/mL

## 2016-09-14 ENCOUNTER — Encounter: Payer: Self-pay | Admitting: Internal Medicine

## 2016-09-14 ENCOUNTER — Ambulatory Visit (INDEPENDENT_AMBULATORY_CARE_PROVIDER_SITE_OTHER): Payer: Self-pay | Admitting: Internal Medicine

## 2016-09-14 DIAGNOSIS — F17210 Nicotine dependence, cigarettes, uncomplicated: Secondary | ICD-10-CM

## 2016-09-14 DIAGNOSIS — F329 Major depressive disorder, single episode, unspecified: Secondary | ICD-10-CM

## 2016-09-14 DIAGNOSIS — B20 Human immunodeficiency virus [HIV] disease: Secondary | ICD-10-CM

## 2016-09-14 DIAGNOSIS — F32A Depression, unspecified: Secondary | ICD-10-CM

## 2016-09-14 NOTE — Assessment & Plan Note (Signed)
His depression is in remission. 

## 2016-09-14 NOTE — Assessment & Plan Note (Signed)
I talked him again about the importance of quitting cigarettes completely. He has strong family history of heart disease and COPD.

## 2016-09-14 NOTE — Progress Notes (Signed)
Patient Active Problem List   Diagnosis Date Noted  . HIV disease (HCC) 02/14/2015    Priority: High  . Recurrent pneumonia 02/11/2015    Priority: High  . Penile lesion 05/12/2015  . Boil of buttock 03/31/2015  . Dysphagia 02/19/2015  . Depression 02/19/2015  . GERD (gastroesophageal reflux disease) 02/14/2015  . Cigarette smoker 02/14/2015    Patient's Medications  New Prescriptions   No medications on file  Previous Medications   GENVOYA 150-150-200-10 MG TABS TABLET    TAKE 1 TABLET BY MOUTH DAILY WITH BREAKFAST  Modified Medications   No medications on file  Discontinued Medications   METHOCARBAMOL (ROBAXIN) 500 MG TABLET    Take 1 tablet (500 mg total) by mouth 2 (two) times daily.    Subjective: Nazair is in for his routine HIV follow-up visit. He recalls missing his Genvoya only 2-3 times since his last visit. He is still smoking cigarettes and has no current plan to quit. He is no longer feeling depressed. He will sometimes have some upset stomach that he thinks may be related to [facility].   Review of Systems: Review of Systems  Constitutional: Negative for chills, diaphoresis, fever, malaise/fatigue and weight loss.  HENT: Negative for sore throat.   Respiratory: Negative for cough, sputum production and shortness of breath.   Cardiovascular: Negative for chest pain.  Gastrointestinal: Positive for nausea. Negative for abdominal pain, diarrhea, heartburn and vomiting.  Genitourinary: Negative for dysuria and frequency.  Musculoskeletal: Negative for joint pain and myalgias.  Skin: Negative for rash.  Neurological: Negative for dizziness and headaches.  Psychiatric/Behavioral: Negative for depression and substance abuse. The patient is not nervous/anxious.     Past Medical History:  Diagnosis Date  . Asthma    childhood  . GERD (gastroesophageal reflux disease)   . Pneumonia     Social History  Substance Use Topics  . Smoking status: Current  Some Day Smoker    Packs/day: 0.40    Types: Cigarettes  . Smokeless tobacco: Never Used     Comment: ready to quit "soon"  . Alcohol use 0.0 oz/week     Comment: occ    Family History  Problem Relation Age of Onset  . Hypertension Mother   . GER disease Mother   . Fibromyalgia Mother   . COPD Mother   . Hypertension Father   . Diabetes Father   . COPD Father     No Known Allergies  Objective:  Vitals:   09/14/16 1450  BP: 124/80  Pulse: 61  Temp: 97.8 F (36.6 C)  TempSrc: Oral  Weight: 206 lb (93.4 kg)  Height:  (1.905 m)   Body mass index is 25.75 kg/m.  Physical Exam  Constitutional: He is oriented to person, place, and time.  He is in good spirits.  HENT:  Mouth/Throat: No oropharyngeal exudate.  Eyes: Conjunctivae are normal.  Cardiovascular: Normal rate and regular rhythm.   No murmur heard. Pulmonary/Chest: Effort normal and breath sounds normal.  Abdominal: Soft. He exhibits no mass. There is no tenderness.  Musculoskeletal: Normal range of motion.  Neurological: He is alert and oriented to person, place, and time.  Skin: No rash noted.  Psychiatric: Mood and affect normal.    Lab Results Lab Results  Component Value Date   WBC 7.1 08/31/2016   HGB 14.2 08/31/2016   HCT 42.2 08/31/2016   MCV 99.3 08/31/2016   PLT 222 08/31/2016  Lab Results  Component Value Date   CREATININE 1.27 08/31/2016   BUN 12 08/31/2016   NA 139 08/31/2016   K 4.0 08/31/2016   CL 106 08/31/2016   CO2 26 08/31/2016    Lab Results  Component Value Date   ALT 18 08/31/2016   AST 21 08/31/2016   ALKPHOS 41 08/31/2016   BILITOT 0.8 08/31/2016    Lab Results  Component Value Date   CHOL 179 08/31/2016   HDL 53 08/31/2016   LDLCALC 112 (H) 08/31/2016   TRIG 69 08/31/2016   CHOLHDL 3.4 08/31/2016   HIV 1 RNA Quant (copies/mL)  Date Value  08/31/2016 <20 NOT DETECTED  11/04/2015 <20  07/31/2015 <20   CD4 T Cell Abs (/uL)  Date Value    08/31/2016 940  11/04/2015 950  07/31/2015 560     Problem List Items Addressed This Visit      High   HIV disease (HCC)    His infection has come under excellent, long-term control. I encouraged him to try not to miss a single dose of his Genvoya. He will follow-up after lab work in 6 months.      Relevant Orders   T-helper cell (CD4)- ([facility] clinic only)   HIV 1 RNA quant-no reflex-bld     Unprioritized   Cigarette smoker    I talked him again about the importance of quitting cigarettes completely. He has strong family history of heart disease and COPD.      Depression    His depression is in remission.           Cliffton Asters, MD [facility] [phone] pager   [phone] cell 09/14/2016, 3:40 PM

## 2016-09-14 NOTE — Assessment & Plan Note (Signed)
His infection has come under excellent, long-term control. I encouraged him to try not to miss a single dose of his Genvoya. He will follow-up after lab work in 6 months.

## 2016-12-13 ENCOUNTER — Ambulatory Visit: Payer: Self-pay

## 2016-12-22 ENCOUNTER — Encounter: Payer: Self-pay | Admitting: Internal Medicine

## 2017-01-22 ENCOUNTER — Other Ambulatory Visit: Payer: Self-pay | Admitting: Internal Medicine

## 2017-01-22 DIAGNOSIS — Z21 Asymptomatic human immunodeficiency virus [HIV] infection status: Secondary | ICD-10-CM

## 2017-03-08 ENCOUNTER — Emergency Department (HOSPITAL_COMMUNITY)
Admission: EM | Admit: 2017-03-08 | Discharge: 2017-03-09 | Disposition: A | Discharge status: Other Acute Inpatient Hospital | Payer: Self-pay | Attending: Emergency Medicine | Admitting: Emergency Medicine

## 2017-03-08 ENCOUNTER — Emergency Department (HOSPITAL_COMMUNITY): Payer: Self-pay | Admitting: Anesthesiology

## 2017-03-08 ENCOUNTER — Emergency Department (HOSPITAL_COMMUNITY): Payer: Self-pay

## 2017-03-08 ENCOUNTER — Encounter (HOSPITAL_COMMUNITY): Payer: Self-pay | Admitting: Emergency Medicine

## 2017-03-08 DIAGNOSIS — S065X9A Traumatic subdural hemorrhage with loss of consciousness of unspecified duration, initial encounter: Secondary | ICD-10-CM

## 2017-03-08 DIAGNOSIS — Y999 Unspecified external cause status: Secondary | ICD-10-CM | POA: Insufficient documentation

## 2017-03-08 DIAGNOSIS — S065XAA Traumatic subdural hemorrhage with loss of consciousness status unknown, initial encounter: Secondary | ICD-10-CM

## 2017-03-08 DIAGNOSIS — I609 Nontraumatic subarachnoid hemorrhage, unspecified: Secondary | ICD-10-CM | POA: Insufficient documentation

## 2017-03-08 DIAGNOSIS — Z23 Encounter for immunization: Secondary | ICD-10-CM | POA: Insufficient documentation

## 2017-03-08 DIAGNOSIS — Y929 Unspecified place or not applicable: Secondary | ICD-10-CM | POA: Insufficient documentation

## 2017-03-08 DIAGNOSIS — Y249XXA Unspecified firearm discharge, undetermined intent, initial encounter: Secondary | ICD-10-CM | POA: Insufficient documentation

## 2017-03-08 DIAGNOSIS — R0682 Tachypnea, not elsewhere classified: Secondary | ICD-10-CM | POA: Insufficient documentation

## 2017-03-08 DIAGNOSIS — I6200 Nontraumatic subdural hemorrhage, unspecified: Secondary | ICD-10-CM | POA: Insufficient documentation

## 2017-03-08 DIAGNOSIS — Y939 Activity, unspecified: Secondary | ICD-10-CM | POA: Insufficient documentation

## 2017-03-08 DIAGNOSIS — S0292XB Unspecified fracture of facial bones, initial encounter for open fracture: Secondary | ICD-10-CM | POA: Insufficient documentation

## 2017-03-08 LAB — ETHANOL: Alcohol, Ethyl (B): 105 mg/dL — ABNORMAL HIGH (ref <5)

## 2017-03-08 LAB — COMPREHENSIVE METABOLIC PANEL
ALBUMIN: 4.2 g/dL (ref 3.5–5.0)
ALK PHOS: 39 U/L (ref 38–126)
ALT: 20 U/L (ref 17–63)
AST: 45 U/L — ABNORMAL HIGH (ref 15–41)
Anion gap: 19 — ABNORMAL HIGH (ref 5–15)
BUN: 15 mg/dL (ref 6–20)
CALCIUM: 8.5 mg/dL — AB (ref 8.9–10.3)
CHLORIDE: 105 mmol/L (ref 101–111)
CO2: 13 mmol/L — AB (ref 22–32)
CREATININE: 1.65 mg/dL — AB (ref 0.61–1.24)
GFR calc non Af Amer: 49 mL/min — ABNORMAL LOW (ref >60)
GFR, EST AFRICAN AMERICAN: 57 mL/min — AB (ref >60)
GLUCOSE: 118 mg/dL — AB (ref 65–99)
Potassium: 3.7 mmol/L (ref 3.5–5.1)
SODIUM: 137 mmol/L (ref 135–145)
Total Bilirubin: 1.3 mg/dL — ABNORMAL HIGH (ref 0.3–1.2)
Total Protein: 7.7 g/dL (ref 6.5–8.1)

## 2017-03-08 LAB — I-STAT CHEM 8, ED
BUN: 19 mg/dL (ref 6–20)
CHLORIDE: 106 mmol/L (ref 101–111)
CREATININE: 1.7 mg/dL — AB (ref 0.61–1.24)
Calcium, Ion: 0.95 mmol/L — ABNORMAL LOW (ref 1.15–1.40)
GLUCOSE: 121 mg/dL — AB (ref 65–99)
HCT: 43 % (ref 39.0–52.0)
Hemoglobin: 14.6 g/dL (ref 13.0–17.0)
POTASSIUM: 3.8 mmol/L (ref 3.5–5.1)
Sodium: 139 mmol/L (ref 135–145)
TCO2: 18 mmol/L — ABNORMAL LOW (ref 22–32)

## 2017-03-08 LAB — I-STAT CG4 LACTIC ACID, ED: Lactic Acid, Venous: 10.93 mmol/L (ref 0.5–1.9)

## 2017-03-08 LAB — CDS SEROLOGY

## 2017-03-08 LAB — CBC
HCT: 40.1 % (ref 39.0–52.0)
HEMOGLOBIN: 13.6 g/dL (ref 13.0–17.0)
MCH: 34.5 pg — AB (ref 26.0–34.0)
MCHC: 33.9 g/dL (ref 30.0–36.0)
MCV: 101.8 fL — ABNORMAL HIGH (ref 78.0–100.0)
Platelets: 245 10*3/uL (ref 150–400)
RBC: 3.94 MIL/uL — AB (ref 4.22–5.81)
RDW: 13 % (ref 11.5–15.5)
WBC: 20.5 10*3/uL — ABNORMAL HIGH (ref 4.0–10.5)

## 2017-03-08 MED ORDER — ETOMIDATE 2 MG/ML IV SOLN
INTRAVENOUS | Status: AC | PRN
  Administered 2017-03-08 (×2): 30 mg via INTRAVENOUS

## 2017-03-08 MED ORDER — PROPOFOL 1000 MG/100ML IV EMUL
INTRAVENOUS | Status: AC | PRN
  Administered 2017-03-08: 5 ug/kg/min via INTRAVENOUS
  Administered 2017-03-08: 20 ug/kg/min via INTRAVENOUS

## 2017-03-08 MED ORDER — MIDAZOLAM HCL 2 MG/2ML IJ SOLN
INTRAMUSCULAR | Status: DC
Start: 2017-03-08 — End: 2017-03-09
  Filled 2017-03-08: qty 2

## 2017-03-08 MED ORDER — MIDAZOLAM HCL 5 MG/5ML IJ SOLN
INTRAMUSCULAR | Status: AC | PRN
  Administered 2017-03-08: 2 mg via INTRAVENOUS

## 2017-03-08 MED ORDER — SUCCINYLCHOLINE CHLORIDE 20 MG/ML IJ SOLN
INTRAMUSCULAR | Status: AC | PRN
  Administered 2017-03-08 (×2): 200 mg via INTRAVENOUS

## 2017-03-08 MED ORDER — FENTANYL CITRATE (PF) 100 MCG/2ML IJ SOLN
INTRAMUSCULAR | Status: AC | PRN
Start: 2017-03-08 — End: 2017-03-08
  Administered 2017-03-08 (×4): 50 ug via INTRAVENOUS

## 2017-03-08 MED ORDER — FENTANYL CITRATE (PF) 100 MCG/2ML IJ SOLN
INTRAMUSCULAR | Status: AC
  Filled 2017-03-08: qty 4

## 2017-03-08 MED ORDER — SODIUM CHLORIDE 0.9 % IV SOLN
INTRAVENOUS | Status: AC | PRN
  Administered 2017-03-08: 1000 mL via INTRAVENOUS

## 2017-03-08 MED ORDER — ROCURONIUM BROMIDE 50 MG/5ML IV SOLN
INTRAVENOUS | Status: AC | PRN
  Administered 2017-03-08: 40 mg via INTRAVENOUS

## 2017-03-08 NOTE — ED Notes (Signed)
Dr. Dwain Sarna informed nurse that pt. will be transferred to [facility] .

## 2017-03-08 NOTE — Transfer of Care (Signed)
OOD  Emergent intubation ED
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
## 2017-03-08 NOTE — ED Notes (Signed)
Dr. Suszanne Conners, S. ( Maxillofacial ) arrived to evaluated pt.
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
## 2017-03-08 NOTE — ED Notes (Signed)
Bloody drainage from mouth/nose suctioned, ETT/OGT intact , pt. Given Fentanyl 50 mcg/Versed 2 mg IV given - pt. attempted to reach his ETT.
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
## 2017-03-08 NOTE — ED Notes (Signed)
Patient transported to CT scan . 

## 2017-03-08 NOTE — ED Notes (Signed)
Dr. Randon Goldsmith ,G. ( Opthalmologist) at bedside evaluating pt.
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
## 2017-03-09 ENCOUNTER — Encounter: Payer: Self-pay | Admitting: Internal Medicine

## 2017-03-09 ENCOUNTER — Telehealth: Payer: Self-pay | Admitting: *Deleted

## 2017-03-09 DIAGNOSIS — S069XAA Unspecified intracranial injury with loss of consciousness status unknown, initial encounter: Secondary | ICD-10-CM | POA: Insufficient documentation

## 2017-03-09 DIAGNOSIS — S0193XA Puncture wound without foreign body of unspecified part of head, initial encounter: Secondary | ICD-10-CM | POA: Insufficient documentation

## 2017-03-09 DIAGNOSIS — S0531XA Ocular laceration without prolapse or loss of intraocular tissue, right eye, initial encounter: Secondary | ICD-10-CM | POA: Insufficient documentation

## 2017-03-09 DIAGNOSIS — W3400XA Accidental discharge from unspecified firearms or gun, initial encounter: Secondary | ICD-10-CM | POA: Insufficient documentation

## 2017-03-09 DIAGNOSIS — S069X9A Unspecified intracranial injury with loss of consciousness of unspecified duration, initial encounter: Secondary | ICD-10-CM | POA: Insufficient documentation

## 2017-03-09 LAB — TYPE AND SCREEN
ABO/RH(D): A POS
ANTIBODY SCREEN: NEGATIVE
Unit division: 0
Unit division: 0
Unit division: 0
Unit division: 0

## 2017-03-09 LAB — BPAM RBC
BLOOD PRODUCT EXPIRATION DATE: 201810122359
BLOOD PRODUCT EXPIRATION DATE: 201810122359
Blood Product Expiration Date: 201810122359
Blood Product Expiration Date: 201810172359
ISSUE DATE / TIME: 201809251433
ISSUE DATE / TIME: 201809252124
ISSUE DATE / TIME: 201809252124
UNIT TYPE AND RH: 9500
Unit Type and Rh: 9500
Unit Type and Rh: 9500
Unit Type and Rh: 9500

## 2017-03-09 LAB — PREPARE FRESH FROZEN PLASMA
UNIT DIVISION: 0
Unit division: 0

## 2017-03-09 LAB — BPAM FFP
BLOOD PRODUCT EXPIRATION DATE: 201810112359
Blood Product Expiration Date: 201810032359
ISSUE DATE / TIME: 201809252125
ISSUE DATE / TIME: 201809252125
UNIT TYPE AND RH: 6200
UNIT TYPE AND RH: 6200

## 2017-03-09 LAB — ABO/RH: ABO/RH(D): A POS

## 2017-03-09 MED ORDER — FENTANYL CITRATE (PF) 100 MCG/2ML IJ SOLN
50.0000 ug | Freq: Once | INTRAMUSCULAR | Status: AC
  Administered 2017-03-09: 50 ug via INTRAVENOUS
  Filled 2017-03-09: qty 2

## 2017-03-09 MED ORDER — CEFAZOLIN SODIUM-DEXTROSE 1-4 GM/50ML-% IV SOLN
1.0000 g | Freq: Once | INTRAVENOUS | Status: AC
  Administered 2017-03-09: 1 g via INTRAVENOUS
  Filled 2017-03-09: qty 50

## 2017-03-09 MED ORDER — PROPOFOL 1000 MG/100ML IV EMUL
INTRAVENOUS | Status: AC
  Filled 2017-03-09: qty 100

## 2017-03-09 MED ORDER — TETANUS-DIPHTH-ACELL PERTUSSIS 5-2.5-18.5 LF-MCG/0.5 IM SUSP
0.5000 mL | Freq: Once | INTRAMUSCULAR | Status: AC
  Administered 2017-03-09: 0.5 mL via INTRAMUSCULAR
  Filled 2017-03-09: qty 0.5

## 2017-03-09 NOTE — ED Notes (Signed)
Pt.'s spouse at bedside, chaplain notified by secretary for emotional support.

## 2017-03-09 NOTE — ED Notes (Signed)
Report given to T. Stone Charity fundraiser ( Press photographer ) at [facility] emergency dept. , [facility] notified by secretary to transport pt.
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
## 2017-03-09 NOTE — Telephone Encounter (Signed)
Message from Scot Dock, RN at [facility] that he has been admitted to [facility].  He will fax over progress notes. Andree Coss, RN   Jim Office: [phone] Pager: [phone] Cell: [phone]

## 2017-04-13 ENCOUNTER — Encounter (HOSPITAL_COMMUNITY): Payer: Self-pay

## 2017-04-13 ENCOUNTER — Emergency Department (HOSPITAL_COMMUNITY)
Admission: EM | Admit: 2017-04-13 | Discharge: 2017-04-14 | Disposition: A | Discharge status: Home / Self Care | Payer: Self-pay | Attending: Emergency Medicine | Admitting: Emergency Medicine

## 2017-04-13 DIAGNOSIS — J45909 Unspecified asthma, uncomplicated: Secondary | ICD-10-CM | POA: Insufficient documentation

## 2017-04-13 DIAGNOSIS — T85598A Other mechanical complication of other gastrointestinal prosthetic devices, implants and grafts, initial encounter: Secondary | ICD-10-CM | POA: Insufficient documentation

## 2017-04-13 DIAGNOSIS — Y658 Other specified misadventures during surgical and medical care: Secondary | ICD-10-CM | POA: Insufficient documentation

## 2017-04-13 DIAGNOSIS — K9423 Gastrostomy malfunction: Secondary | ICD-10-CM

## 2017-04-13 DIAGNOSIS — Z79899 Other long term (current) drug therapy: Secondary | ICD-10-CM | POA: Insufficient documentation

## 2017-04-13 NOTE — ED Notes (Signed)
Facilities and OR do not have a 24 french PEG tube.  [facility] the [facility] and she is hunting one down.
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
## 2017-04-13 NOTE — ED Notes (Signed)
Been trying to get 24 in [place]French peg tube from facilities since 2001
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
## 2017-04-13 NOTE — ED Triage Notes (Signed)
Patient here with "clogged feeding tube" this am liquid wouldn't go down, has had no previous problems

## 2017-04-14 ENCOUNTER — Emergency Department (HOSPITAL_COMMUNITY): Payer: Self-pay

## 2017-04-14 MED ORDER — IOPAMIDOL (ISOVUE-300) INJECTION 61%
INTRAVENOUS | Status: AC
  Administered 2017-04-14: 30 mL
  Filled 2017-04-14: qty 50

## 2017-04-14 NOTE — ED Notes (Signed)
Patient transported to X-ray 

## 2017-04-14 NOTE — Discharge Instructions (Signed)
Please flush the tube frequently after usage. See the [facility] doctors in 1-2 weeks. Return to the ER if there is any complication.

## 2018-01-30 ENCOUNTER — Other Ambulatory Visit: Payer: Self-pay | Admitting: Internal Medicine

## 2018-01-30 DIAGNOSIS — Z21 Asymptomatic human immunodeficiency virus [HIV] infection status: Secondary | ICD-10-CM

## 2018-02-06 ENCOUNTER — Ambulatory Visit: Payer: Self-pay | Attending: Family Medicine | Admitting: Family Medicine

## 2018-02-06 ENCOUNTER — Other Ambulatory Visit: Payer: Self-pay

## 2018-02-06 ENCOUNTER — Telehealth: Payer: Self-pay

## 2018-02-06 ENCOUNTER — Encounter: Payer: Self-pay | Admitting: Family Medicine

## 2018-02-06 VITALS — BP 108/74 | HR 96 | Temp 99.0°F | Resp 18 | Ht 74.0 in | Wt 146.0 lb

## 2018-02-06 DIAGNOSIS — Z8659 Personal history of other mental and behavioral disorders: Secondary | ICD-10-CM | POA: Insufficient documentation

## 2018-02-06 DIAGNOSIS — M792 Neuralgia and neuritis, unspecified: Secondary | ICD-10-CM

## 2018-02-06 DIAGNOSIS — Z915 Personal history of self-harm: Secondary | ICD-10-CM | POA: Insufficient documentation

## 2018-02-06 DIAGNOSIS — G8929 Other chronic pain: Secondary | ICD-10-CM

## 2018-02-06 DIAGNOSIS — Z21 Asymptomatic human immunodeficiency virus [HIV] infection status: Secondary | ICD-10-CM

## 2018-02-06 DIAGNOSIS — B370 Candidal stomatitis: Secondary | ICD-10-CM

## 2018-02-06 DIAGNOSIS — B20 Human immunodeficiency virus [HIV] disease: Secondary | ICD-10-CM

## 2018-02-06 DIAGNOSIS — F17210 Nicotine dependence, cigarettes, uncomplicated: Secondary | ICD-10-CM | POA: Insufficient documentation

## 2018-02-06 DIAGNOSIS — R200 Anesthesia of skin: Secondary | ICD-10-CM | POA: Insufficient documentation

## 2018-02-06 DIAGNOSIS — Z9151 Personal history of suicidal behavior: Secondary | ICD-10-CM | POA: Insufficient documentation

## 2018-02-06 DIAGNOSIS — R6884 Jaw pain: Secondary | ICD-10-CM

## 2018-02-06 DIAGNOSIS — K029 Dental caries, unspecified: Secondary | ICD-10-CM | POA: Insufficient documentation

## 2018-02-06 DIAGNOSIS — M26609 Unspecified temporomandibular joint disorder, unspecified side: Secondary | ICD-10-CM

## 2018-02-06 DIAGNOSIS — F329 Major depressive disorder, single episode, unspecified: Secondary | ICD-10-CM | POA: Insufficient documentation

## 2018-02-06 MED ORDER — NYSTATIN 100000 UNIT/ML MT SUSP: 5.0000 mL | Freq: Four times a day (QID) | OROMUCOSAL | 6 refills | Status: DC

## 2018-02-06 MED ORDER — GABAPENTIN 100 MG PO CAPS: ORAL_CAPSULE | ORAL | 11 refills | Status: DC

## 2018-02-06 MED ORDER — TRAMADOL HCL 50 MG PO TABS: 50.0000 mg | ORAL_TABLET | Freq: Three times a day (TID) | ORAL | 4 refills | Status: DC | PRN

## 2018-02-06 NOTE — Telephone Encounter (Signed)
Patient requested for resources for a counselor. Patient was given a business card to reach out to Child psychotherapistsocial [facility], as well as informed that she is out of the office today.

## 2018-02-06 NOTE — Progress Notes (Signed)
Patient denied a flu shot today.

## 2018-02-06 NOTE — Patient Instructions (Signed)
Temporomandibular Joint Syndrome Temporomandibular joint (TMJ) syndrome is a condition that affects the joints between your jaw and your skull. The TMJs are located near your ears and allow your jaw to open and close. These joints and the nearby muscles are involved in all movements of the jaw. People with TMJ syndrome have pain in the area of these joints and muscles. Chewing, biting, or other movements of the jaw can be difficult or painful. TMJ syndrome can be caused by various things. In many cases, the condition is mild and goes away within a few weeks. For some people, the condition can become a long-term problem. What are the causes? Possible causes of TMJ syndrome include:  Grinding your teeth or clenching your jaw. Some people do this when they are under stress.  Arthritis.  Injury to the jaw.  Head or neck injury.  Teeth or dentures that are not aligned well.  In some cases, the cause of TMJ syndrome may not be known. What are the signs or symptoms? The most common symptom is an aching pain on the side of the head in the area of the TMJ. Other symptoms may include:  Pain when moving your jaw, such as when chewing or biting.  Being unable to open your jaw all the way.  Making a clicking sound when you open your mouth.  Headache.  Earache.  Neck or shoulder pain.  How is this diagnosed? Diagnosis can usually be made based on your symptoms, your medical history, and a physical exam. Your health care provider may check the range of motion of your jaw. Imaging tests, such as X-rays or an MRI, are sometimes done. You may need to see your dentist to determine if your teeth and jaw are lined up correctly. How is this treated? TMJ syndrome often goes away on its own. If treatment is needed, the options may include:  Eating soft foods and applying ice or heat.  Medicines to relieve pain or inflammation.  Medicines to relax the muscles.  A splint, bite plate, or mouthpiece  to prevent teeth grinding or jaw clenching.  Relaxation techniques or counseling to help reduce stress.  Transcutaneous electrical nerve stimulation (TENS). This helps to relieve pain by applying an electrical current through the skin.  Acupuncture. This is sometimes helpful to relieve pain.  Jaw surgery. This is rarely needed.  Follow these instructions at home:  Take medicines only as directed by your health care provider.  Eat a soft diet if you are having trouble chewing.  Apply ice to the painful area. ? Put ice in a plastic bag. ? Place a towel between your skin and the bag. ? Leave the ice on for 20 minutes, 2-3 times a day.  Apply a warm compress to the painful area as directed.  Massage your jaw area and perform any jaw stretching exercises as recommended by your health care provider.  If you were given a mouthpiece or bite plate, wear it as directed.  Avoid foods that require a lot of chewing. Do not chew gum.  Keep all follow-up visits as directed by your health care provider. This is important. Contact a health care provider if:  You are having trouble eating.  You have new or worsening symptoms. Get help right away if:  Your jaw locks open or closed. This information is not intended to replace advice given to you by your health care provider. Make sure you discuss any questions you have with your health care provider. Document   Released: 02/23/2001 Document Revised: 01/29/2016 Document Reviewed: 01/03/2014 Elsevier Interactive Patient Education  2018 Elsevier Inc.  
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
## 2018-02-17 ENCOUNTER — Telehealth: Payer: Self-pay | Admitting: Licensed Clinical Social Worker

## 2018-02-17 NOTE — Telephone Encounter (Signed)
Contacted pt to follow-up on counseling resources. Social worker will mail counseling resources to pt.   Norton Blizzard, MSW Intern 02/17/2018, 2:39pm

## 2018-02-20 ENCOUNTER — Encounter: Payer: Self-pay | Admitting: Internal Medicine

## 2018-02-20 ENCOUNTER — Ambulatory Visit: Payer: Self-pay

## 2018-02-20 ENCOUNTER — Ambulatory Visit (INDEPENDENT_AMBULATORY_CARE_PROVIDER_SITE_OTHER): Payer: Self-pay | Admitting: Pharmacist

## 2018-02-20 ENCOUNTER — Other Ambulatory Visit: Payer: Self-pay | Admitting: Internal Medicine

## 2018-02-20 ENCOUNTER — Ambulatory Visit (INDEPENDENT_AMBULATORY_CARE_PROVIDER_SITE_OTHER): Payer: Self-pay | Admitting: Licensed Clinical Social Worker

## 2018-02-20 ENCOUNTER — Other Ambulatory Visit: Payer: Self-pay

## 2018-02-20 DIAGNOSIS — F331 Major depressive disorder, recurrent, moderate: Secondary | ICD-10-CM

## 2018-02-20 DIAGNOSIS — Z113 Encounter for screening for infections with a predominantly sexual mode of transmission: Secondary | ICD-10-CM

## 2018-02-20 DIAGNOSIS — B20 Human immunodeficiency virus [HIV] disease: Secondary | ICD-10-CM

## 2018-02-20 DIAGNOSIS — Z5181 Encounter for therapeutic drug level monitoring: Secondary | ICD-10-CM

## 2018-02-20 MED ORDER — DARUN-COBIC-EMTRICIT-TENOFAF 800-150-200-10 MG PO TABS: 1.0000 | ORAL_TABLET | Freq: Every day | ORAL | 5 refills | Status: DC

## 2018-02-20 MED FILL — SYMTUZA 800-150-200-10 MG T: 800-150-200 | 30 days supply | Qty: 30 | Fill #0

## 2018-02-20 NOTE — Progress Notes (Signed)
Liquid when in [facility].   A few days per month.
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
## 2018-02-21 LAB — T-HELPER CELL (CD4) - (RCID CLINIC ONLY)
CD4 % Helper T Cell: 42 % (ref 33–55)
CD4 T CELL ABS: 870 /uL (ref 400–2700)

## 2018-02-24 LAB — COMPREHENSIVE METABOLIC PANEL
AG Ratio: 1.3 (calc) (ref 1.0–2.5)
ALBUMIN MSPROF: 4.5 g/dL (ref 3.6–5.1)
ALKALINE PHOSPHATASE (APISO): 48 U/L (ref 40–115)
ALT: 20 U/L (ref 9–46)
AST: 19 U/L (ref 10–40)
BILIRUBIN TOTAL: 0.7 mg/dL (ref 0.2–1.2)
BUN: 16 mg/dL (ref 7–25)
CHLORIDE: 105 mmol/L (ref 98–110)
CO2: 27 mmol/L (ref 20–32)
CREATININE: 1.02 mg/dL (ref 0.60–1.35)
Calcium: 10.1 mg/dL (ref 8.6–10.3)
GLOBULIN: 3.4 g/dL (ref 1.9–3.7)
Glucose, Bld: 79 mg/dL (ref 65–99)
POTASSIUM: 4.4 mmol/L (ref 3.5–5.3)
SODIUM: 140 mmol/L (ref 135–146)
TOTAL PROTEIN: 7.9 g/dL (ref 6.1–8.1)

## 2018-02-24 LAB — CBC
HEMATOCRIT: 39.8 % (ref 38.5–50.0)
HEMOGLOBIN: 13.5 g/dL (ref 13.2–17.1)
MCH: 33.3 pg — AB (ref 27.0–33.0)
MCHC: 33.9 g/dL (ref 32.0–36.0)
MCV: 98 fL (ref 80.0–100.0)
MPV: 11.6 fL (ref 7.5–12.5)
Platelets: 211 10*3/uL (ref 140–400)
RBC: 4.06 10*6/uL — ABNORMAL LOW (ref 4.20–5.80)
RDW: 12 % (ref 11.0–15.0)
WBC: 5 10*3/uL (ref 3.8–10.8)

## 2018-02-24 LAB — RPR: RPR Ser Ql: NONREACTIVE

## 2018-02-24 LAB — HIV RNA, RTPCR W/R GT (RTI, PI,INT)
HIV 1 RNA Quant: 21 copies/mL — ABNORMAL HIGH
HIV-1 RNA Quant, Log: 1.32 Log copies/mL — ABNORMAL HIGH

## 2018-03-13 ENCOUNTER — Encounter: Payer: Self-pay | Admitting: Internal Medicine

## 2018-03-13 ENCOUNTER — Ambulatory Visit (INDEPENDENT_AMBULATORY_CARE_PROVIDER_SITE_OTHER): Payer: Self-pay | Admitting: Internal Medicine

## 2018-03-13 DIAGNOSIS — S065XAA Traumatic subdural hemorrhage with loss of consciousness status unknown, initial encounter: Secondary | ICD-10-CM

## 2018-03-13 DIAGNOSIS — K9423 Gastrostomy malfunction: Secondary | ICD-10-CM

## 2018-03-13 DIAGNOSIS — B20 Human immunodeficiency virus [HIV] disease: Secondary | ICD-10-CM

## 2018-03-13 DIAGNOSIS — Z8659 Personal history of other mental and behavioral disorders: Secondary | ICD-10-CM

## 2018-03-13 DIAGNOSIS — S065X9A Traumatic subdural hemorrhage with loss of consciousness of unspecified duration, initial encounter: Secondary | ICD-10-CM

## 2018-03-13 NOTE — Assessment & Plan Note (Signed)
Infection remains under good control.  He will continue Symtuza and follow-up for lab work in 4 to 6 weeks.  Priority focus near future will be working with Berna Spare and his wife to process the trauma they have been through.
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
## 2018-03-15 MED ORDER — VALPROIC ACID LIQD: 5.0000 mL | Freq: Two times a day (BID) | 0 refills | Status: DC

## 2018-03-15 MED ORDER — RISPERIDONE 0.25 MG PO TABS
1.0000 mg | ORAL_TABLET | Freq: Every day | ORAL | Status: AC
Start: 2018-03-15 — End: ?

## 2018-03-15 NOTE — Addendum Note (Signed)
Addended by: Cliffton Asters on: 03/15/2018 01:24 PM   Modules accepted: Orders

## 2018-03-28 ENCOUNTER — Ambulatory Visit (INDEPENDENT_AMBULATORY_CARE_PROVIDER_SITE_OTHER): Payer: Self-pay

## 2018-03-28 DIAGNOSIS — B20 Human immunodeficiency virus [HIV] disease: Secondary | ICD-10-CM

## 2018-03-28 DIAGNOSIS — Z23 Encounter for immunization: Secondary | ICD-10-CM

## 2018-03-28 NOTE — Progress Notes (Signed)
Verbal order received per Dr. Orvan Falconer  to have Flu and Prevnar vaccines today. Vaccines administered and patient tolerated well.  S.Ociel Retherford, LPN

## 2018-04-11 ENCOUNTER — Other Ambulatory Visit: Payer: Self-pay

## 2018-04-11 DIAGNOSIS — B20 Human immunodeficiency virus [HIV] disease: Secondary | ICD-10-CM

## 2018-04-12 LAB — T-HELPER CELL (CD4) - (RCID CLINIC ONLY)
CD4 % Helper T Cell: 42 % (ref 33–55)
CD4 T CELL ABS: 920 /uL (ref 400–2700)

## 2018-04-13 LAB — HIV-1 RNA QUANT-NO REFLEX-BLD
HIV 1 RNA Quant: <20 copies/mL
HIV-1 RNA Quant, Log: <1.3 Log copies/mL

## 2018-04-25 ENCOUNTER — Encounter: Payer: Self-pay | Admitting: Internal Medicine

## 2018-04-25 ENCOUNTER — Ambulatory Visit (INDEPENDENT_AMBULATORY_CARE_PROVIDER_SITE_OTHER): Payer: Self-pay | Admitting: Internal Medicine

## 2018-04-25 DIAGNOSIS — B20 Human immunodeficiency virus [HIV] disease: Secondary | ICD-10-CM
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
## 2018-04-25 NOTE — Assessment & Plan Note (Signed)
His adherence is much improved and his infection is now under excellent control.  He will continue Symtuza and follow-up in 3 months.

## 2018-05-03 ENCOUNTER — Other Ambulatory Visit: Payer: Self-pay | Admitting: Behavioral Health

## 2018-05-03 DIAGNOSIS — B20 Human immunodeficiency virus [HIV] disease: Secondary | ICD-10-CM

## 2018-05-03 MED ORDER — DARUN-COBIC-EMTRICIT-TENOFAF 800-150-200-10 MG PO TABS: 1.0000 | ORAL_TABLET | Freq: Every day | ORAL | 4 refills | Status: DC

## 2018-05-09 ENCOUNTER — Encounter: Payer: Self-pay | Admitting: Family Medicine

## 2018-05-09 ENCOUNTER — Ambulatory Visit: Payer: Self-pay | Attending: Family Medicine | Admitting: Family Medicine

## 2018-05-09 VITALS — BP 112/71 | HR 81 | Resp 18 | Ht 75.0 in | Wt 149.2 lb

## 2018-05-09 DIAGNOSIS — Z9889 Other specified postprocedural states: Secondary | ICD-10-CM | POA: Insufficient documentation

## 2018-05-09 DIAGNOSIS — Z21 Asymptomatic human immunodeficiency virus [HIV] infection status: Secondary | ICD-10-CM

## 2018-05-09 DIAGNOSIS — R52 Pain, unspecified: Secondary | ICD-10-CM | POA: Insufficient documentation

## 2018-05-09 DIAGNOSIS — R6884 Jaw pain: Secondary | ICD-10-CM

## 2018-05-09 DIAGNOSIS — G8929 Other chronic pain: Secondary | ICD-10-CM

## 2018-05-09 DIAGNOSIS — F17210 Nicotine dependence, cigarettes, uncomplicated: Secondary | ICD-10-CM | POA: Insufficient documentation

## 2018-05-09 DIAGNOSIS — B20 Human immunodeficiency virus [HIV] disease: Secondary | ICD-10-CM

## 2018-05-09 DIAGNOSIS — B370 Candidal stomatitis: Secondary | ICD-10-CM

## 2018-05-09 DIAGNOSIS — M792 Neuralgia and neuritis, unspecified: Secondary | ICD-10-CM

## 2018-05-09 MED ORDER — NYSTATIN 100000 UNIT/ML MT SUSP: 5.0000 mL | Freq: Four times a day (QID) | OROMUCOSAL | 6 refills | Status: DC

## 2018-05-09 MED ORDER — TRAMADOL HCL 50 MG PO TABS: 50.0000 mg | ORAL_TABLET | Freq: Three times a day (TID) | ORAL | 2 refills | Status: DC | PRN

## 2018-05-09 MED ORDER — GABAPENTIN 300 MG PO CAPS: ORAL_CAPSULE | ORAL | 4 refills | Status: AC

## 2018-05-09 MED FILL — traMADol HCL 50 MG TABS: 50 | 20 days supply | Qty: 60 | Fill #0

## 2018-05-09 MED FILL — NYSTATIN 100,000 UNITS/ML S: 100000 | 12 days supply | Qty: 240 | Fill #0

## 2018-05-09 MED FILL — GABAPENTIN 300 MG CAPSULE: 300 | 30 days supply | Qty: 120 | Fill #0
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
## 2018-05-18 ENCOUNTER — Ambulatory Visit: Payer: Self-pay

## 2018-05-25 ENCOUNTER — Ambulatory Visit: Payer: Self-pay

## 2018-06-01 ENCOUNTER — Ambulatory Visit: Payer: Self-pay

## 2018-06-01 DIAGNOSIS — F3132 Bipolar disorder, current episode depressed, moderate: Secondary | ICD-10-CM
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
## 2018-06-08 ENCOUNTER — Ambulatory Visit: Payer: Self-pay
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
## 2018-06-15 ENCOUNTER — Ambulatory Visit: Payer: Self-pay

## 2018-06-15 DIAGNOSIS — F3132 Bipolar disorder, current episode depressed, moderate: Secondary | ICD-10-CM
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
## 2018-06-20 ENCOUNTER — Encounter: Payer: Self-pay | Admitting: Internal Medicine

## 2018-06-22 ENCOUNTER — Ambulatory Visit: Payer: Self-pay

## 2018-06-22 NOTE — Progress Notes (Signed)
ERROR
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
## 2018-06-29 ENCOUNTER — Ambulatory Visit: Payer: Self-pay

## 2018-07-06 ENCOUNTER — Ambulatory Visit: Payer: Self-pay

## 2018-07-13 ENCOUNTER — Ambulatory Visit: Payer: Self-pay

## 2018-07-13 NOTE — Progress Notes (Unsigned)
INTERVENTION: Therapist met with patient for outpatient mental health therapy to include ongoing assessment, support, and reinforcement.  Therapist checked in with patient, allowing him to discuss and challenges faced over the prior week along with successful use of coping skills.  Therapist used Solution Focused therapy to assist client in understanding what he would like to see change in his life and how he'll know that things are improving.  Using the Miracle Question- patient answered that would feel like the "fog was lifted" I would feel like the sharpness of my mind was back. Wheels are turning real fast about how I'm going to pull my life back together- how am I going to feed myself. Lack of control over life decisions- has to ask for even the smallest things.  Is having difficulty his loss of independence.  Coping with the feeling of "fear" of the unknown. Struggling with his inability to speak easily and get his thoughts across among family and friends.  What do you miss the most: "freedom" I don't feel like me anymore.

## 2018-07-17 ENCOUNTER — Other Ambulatory Visit: Payer: Self-pay | Admitting: *Deleted

## 2018-07-17 ENCOUNTER — Other Ambulatory Visit: Payer: Self-pay

## 2018-07-17 DIAGNOSIS — B20 Human immunodeficiency virus [HIV] disease: Secondary | ICD-10-CM

## 2018-07-18 LAB — T-HELPER CELL (CD4) - (RCID CLINIC ONLY)
CD4 % Helper T Cell: 44 % (ref 33–55)
CD4 T CELL ABS: 800 /uL (ref 400–2700)

## 2018-07-19 LAB — CBC WITH DIFFERENTIAL/PLATELET
ABSOLUTE MONOCYTES: 405 {cells}/uL (ref 200–950)
BASOS PCT: 1.1 %
Basophils Absolute: 48 cells/uL (ref 0–200)
Eosinophils Absolute: 198 cells/uL (ref 15–500)
Eosinophils Relative: 4.5 %
HCT: 39.6 % (ref 38.5–50.0)
Hemoglobin: 13.6 g/dL (ref 13.2–17.1)
Lymphs Abs: 1861 cells/uL (ref 850–3900)
MCH: 34.4 pg — ABNORMAL HIGH (ref 27.0–33.0)
MCHC: 34.3 g/dL (ref 32.0–36.0)
MCV: 100.3 fL — AB (ref 80.0–100.0)
MONOS PCT: 9.2 %
MPV: 11.3 fL (ref 7.5–12.5)
NEUTROS PCT: 42.9 %
Neutro Abs: 1888 cells/uL (ref 1500–7800)
PLATELETS: 254 10*3/uL (ref 140–400)
RBC: 3.95 10*6/uL — AB (ref 4.20–5.80)
RDW: 11.9 % (ref 11.0–15.0)
TOTAL LYMPHOCYTE: 42.3 %
WBC: 4.4 10*3/uL (ref 3.8–10.8)

## 2018-07-19 LAB — COMPLETE METABOLIC PANEL WITH GFR
AG Ratio: 1.2 (calc) (ref 1.0–2.5)
ALT: 12 U/L (ref 9–46)
AST: 19 U/L (ref 10–40)
Albumin: 4.2 g/dL (ref 3.6–5.1)
Alkaline phosphatase (APISO): 53 U/L (ref 36–130)
BUN: 8 mg/dL (ref 7–25)
CO2: 29 mmol/L (ref 20–32)
Calcium: 9.7 mg/dL (ref 8.6–10.3)
Chloride: 106 mmol/L (ref 98–110)
Creat: 0.93 mg/dL (ref 0.60–1.35)
GFR, Est African American: 114 mL/min/{1.73_m2} (ref >=60)
GFR, Est Non African American: 99 mL/min/{1.73_m2} (ref >=60)
GLOBULIN: 3.6 g/dL (ref 1.9–3.7)
Glucose, Bld: 78 mg/dL (ref 65–99)
Potassium: 5.3 mmol/L (ref 3.5–5.3)
Sodium: 140 mmol/L (ref 135–146)
Total Bilirubin: 0.7 mg/dL (ref 0.2–1.2)
Total Protein: 7.8 g/dL (ref 6.1–8.1)

## 2018-07-19 LAB — HIV-1 RNA QUANT-NO REFLEX-BLD
HIV 1 RNA Quant: <20 copies/mL
HIV-1 RNA Quant, Log: <1.3 Log copies/mL

## 2018-07-20 ENCOUNTER — Ambulatory Visit: Payer: Self-pay

## 2018-07-27 ENCOUNTER — Ambulatory Visit: Payer: Self-pay

## 2018-07-27 NOTE — BH Specialist Note (Signed)
Goal:   Intervention: Therapist met with client for [facility] to include ongoing assessment, support, and reinforcement.  Therapist allowed client to "check in" since previous session; asking client to share any positive coping skills he may have used over the previous week, along with any challenges faced.  Therapist provided supportive listening as client ventilated Therapist assisted client in exploring his feelings related to documentation of moods over the last two weeks.     Effectiveness:  Cient was alert, oriented x3, with no SI, HI, or symptoms of psychosis (risk low).  Client was pleasant and friendly, engaging openly and appropriately with therapist, benefiting from supportive listening and exploration of feelings.  Next session recommended in one to two weeks.

## 2018-07-31 ENCOUNTER — Ambulatory Visit (INDEPENDENT_AMBULATORY_CARE_PROVIDER_SITE_OTHER): Payer: Self-pay | Admitting: Internal Medicine

## 2018-07-31 ENCOUNTER — Encounter: Payer: Self-pay | Admitting: Internal Medicine

## 2018-07-31 DIAGNOSIS — B20 Human immunodeficiency virus [HIV] disease: Secondary | ICD-10-CM
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
## 2018-07-31 NOTE — Assessment & Plan Note (Signed)
His adherence has been excellent.  His infection is under very good long-term control.  He will continue Symtuza and follow-up after lab work in 3 months.

## 2018-08-01 IMAGING — CR DG ABDOMEN 1V
1 series · 1 of 1 positions shown · non-contrast
Comparison: None.

CLINICAL DATA: 46-year-old male with PEG tube placement.

EXAM:
ABDOMEN - 1 VIEW

[abdomen kub]
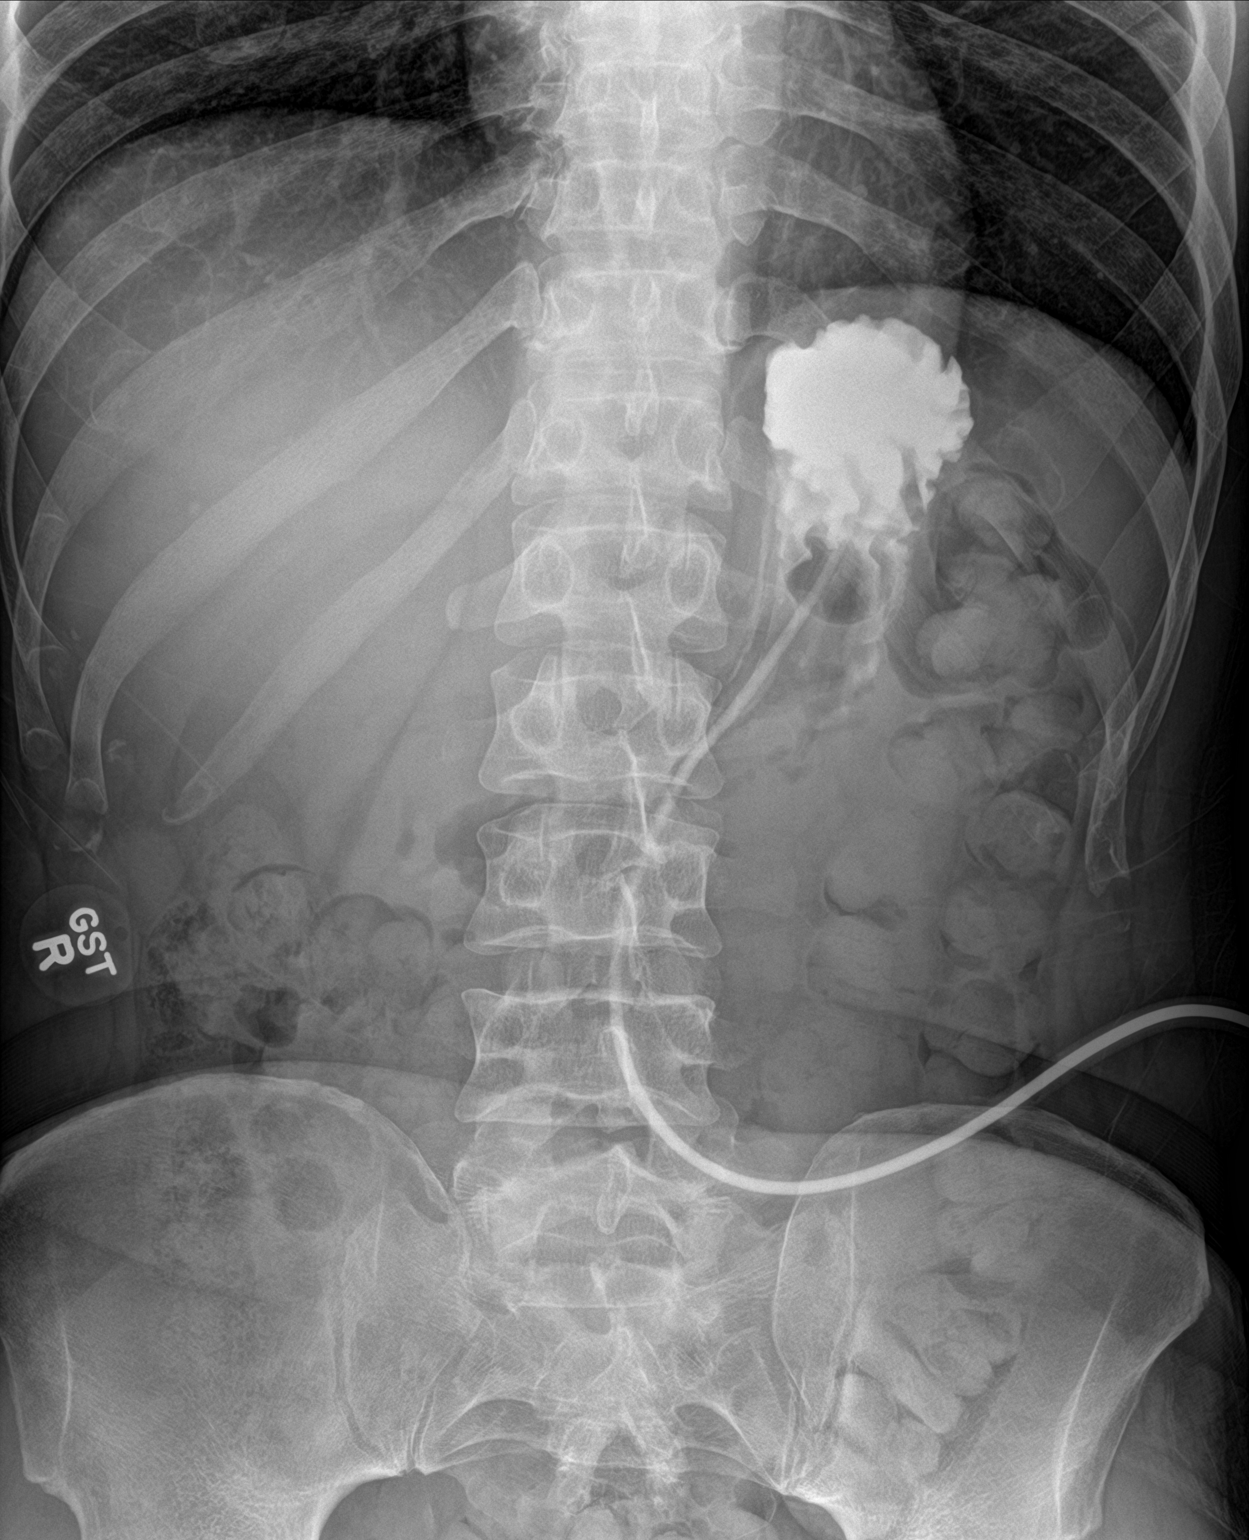

[1 of 1 positions shown; findings below may reference images not displayed]

FINDINGS: A percutaneous gastrostomy is noted. 30 cc of Isovue contrast
injected through the tube opacifies the fundus of the stomach. No
contrast noted outside of the confines of the bowel. There is
moderate amount of stool throughout the colon. No bowel dilatation
or evidence of obstruction. No free air noted on the provided image.
A small rounded calcific focus in the right upper quadrant may
represent a small gallstone. The osseous structures and soft tissues
are unremarkable.
IMPRESSION: Percutaneous gastrostomy with tip in the stomach.

## 2018-08-03 ENCOUNTER — Ambulatory Visit: Payer: Self-pay

## 2018-08-08 ENCOUNTER — Ambulatory Visit: Payer: Self-pay | Admitting: Family Medicine

## 2018-08-11 ENCOUNTER — Encounter: Payer: Self-pay | Admitting: Family Medicine

## 2018-08-11 ENCOUNTER — Ambulatory Visit: Payer: Self-pay | Attending: Family Medicine | Admitting: Family Medicine

## 2018-08-11 VITALS — BP 117/72 | HR 84 | Temp 98.6°F | Resp 18 | Ht 75.0 in | Wt 147.0 lb

## 2018-08-11 DIAGNOSIS — M792 Neuralgia and neuritis, unspecified: Secondary | ICD-10-CM

## 2018-08-11 DIAGNOSIS — G8929 Other chronic pain: Secondary | ICD-10-CM

## 2018-08-11 DIAGNOSIS — M26609 Unspecified temporomandibular joint disorder, unspecified side: Secondary | ICD-10-CM

## 2018-08-11 DIAGNOSIS — R6884 Jaw pain: Secondary | ICD-10-CM

## 2018-08-11 MED ORDER — TRAMADOL HCL 50 MG PO TABS: 50.0000 mg | ORAL_TABLET | Freq: Three times a day (TID) | ORAL | 2 refills | Status: DC | PRN
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
## 2018-08-17 ENCOUNTER — Ambulatory Visit: Payer: Self-pay

## 2018-08-25 ENCOUNTER — Other Ambulatory Visit: Payer: Self-pay | Admitting: Internal Medicine

## 2018-08-25 DIAGNOSIS — B20 Human immunodeficiency virus [HIV] disease: Secondary | ICD-10-CM

## 2018-09-21 ENCOUNTER — Other Ambulatory Visit: Payer: Self-pay

## 2018-09-21 ENCOUNTER — Ambulatory Visit: Payer: Self-pay

## 2018-10-30 ENCOUNTER — Other Ambulatory Visit: Payer: Self-pay

## 2018-10-30 DIAGNOSIS — B20 Human immunodeficiency virus [HIV] disease: Secondary | ICD-10-CM

## 2018-10-31 LAB — T-HELPER CELL (CD4) - (RCID CLINIC ONLY)
CD4 % Helper T Cell: 44 % (ref 33–65)
CD4 T Cell Abs: 664 /uL (ref 400–1790)

## 2018-11-04 LAB — HIV-1 RNA QUANT-NO REFLEX-BLD
HIV 1 RNA Quant: <20 copies/mL
HIV-1 RNA Quant, Log: <1.3 Log copies/mL

## 2018-11-09 ENCOUNTER — Telehealth: Payer: Self-pay | Admitting: Internal Medicine

## 2018-11-09 NOTE — Telephone Encounter (Signed)
COVID-19 Pre-Screening Questions: ° °Do you currently have a fever (>100 °F), chills or unexplained body aches? No  ° °Are you currently experiencing new cough, shortness of breath, sore throat, runny nose? No  °•  °Have you recently travelled outside the state of [place] in the last 14 days? No  °•  °Have you been in contact with someone that is currently pending confirmation of Covid19 testing or has been confirmed to have the Covid19 virus?  No  °

## 2018-11-13 ENCOUNTER — Other Ambulatory Visit: Payer: Self-pay

## 2018-11-13 ENCOUNTER — Ambulatory Visit (INDEPENDENT_AMBULATORY_CARE_PROVIDER_SITE_OTHER): Payer: Self-pay | Admitting: Internal Medicine

## 2018-11-13 ENCOUNTER — Encounter: Payer: Self-pay | Admitting: Internal Medicine

## 2018-11-13 DIAGNOSIS — B20 Human immunodeficiency virus [HIV] disease: Secondary | ICD-10-CM
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
## 2018-11-13 NOTE — Assessment & Plan Note (Signed)
His infection remains under excellent control.  He will continue Symtuza and follow-up after lab work in 3 months.

## 2018-12-08 ENCOUNTER — Ambulatory Visit: Payer: Self-pay | Admitting: Family Medicine

## 2019-01-03 ENCOUNTER — Other Ambulatory Visit: Payer: Self-pay

## 2019-01-03 ENCOUNTER — Ambulatory Visit: Payer: Self-pay

## 2019-01-04 ENCOUNTER — Encounter: Payer: Self-pay | Admitting: Internal Medicine

## 2019-02-13 ENCOUNTER — Other Ambulatory Visit: Payer: Self-pay

## 2019-02-14 ENCOUNTER — Other Ambulatory Visit: Payer: Self-pay

## 2019-02-14 DIAGNOSIS — B20 Human immunodeficiency virus [HIV] disease: Secondary | ICD-10-CM

## 2019-02-15 LAB — T-HELPER CELL (CD4) - (RCID CLINIC ONLY)
CD4 % Helper T Cell: 44 % (ref 33–65)
CD4 T Cell Abs: 1124 /uL (ref 400–1790)

## 2019-02-19 LAB — HIV-1 RNA QUANT-NO REFLEX-BLD
HIV 1 RNA Quant: <20 copies/mL
HIV-1 RNA Quant, Log: <1.3 Log copies/mL

## 2019-02-28 ENCOUNTER — Encounter: Payer: Self-pay | Admitting: Internal Medicine

## 2019-03-15 ENCOUNTER — Other Ambulatory Visit: Payer: Self-pay

## 2019-03-15 ENCOUNTER — Encounter: Payer: Self-pay | Admitting: Internal Medicine

## 2019-03-15 ENCOUNTER — Ambulatory Visit (INDEPENDENT_AMBULATORY_CARE_PROVIDER_SITE_OTHER): Payer: Self-pay | Admitting: Internal Medicine

## 2019-03-15 DIAGNOSIS — B20 Human immunodeficiency virus [HIV] disease: Secondary | ICD-10-CM

## 2019-03-15 DIAGNOSIS — W3400XD Accidental discharge from unspecified firearms or gun, subsequent encounter: Secondary | ICD-10-CM

## 2019-03-15 DIAGNOSIS — F17210 Nicotine dependence, cigarettes, uncomplicated: Secondary | ICD-10-CM

## 2019-03-15 DIAGNOSIS — S0193XD Puncture wound without foreign body of unspecified part of head, subsequent encounter: Secondary | ICD-10-CM

## 2019-03-15 DIAGNOSIS — Z8659 Personal history of other mental and behavioral disorders: Secondary | ICD-10-CM

## 2019-03-15 NOTE — Assessment & Plan Note (Signed)
His adherence is excellent and his infection has been under very good long-term control.  He reports that he has already received his influenza vaccine.  He will continue Symtuza and follow-up after lab work in 6 months.

## 2019-03-15 NOTE — Assessment & Plan Note (Signed)
I encouraged him to go through with his plan to quit smoking completely.

## 2019-03-15 NOTE — Assessment & Plan Note (Signed)
His depression is in remission. 

## 2019-03-15 NOTE — Assessment & Plan Note (Signed)
I encouraged him to call the oral and maxillofacial surgery clinic at [facility] to arrange his referral visit.
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
## 2019-03-23 ENCOUNTER — Other Ambulatory Visit: Payer: Self-pay | Admitting: Internal Medicine

## 2019-03-23 DIAGNOSIS — B20 Human immunodeficiency virus [HIV] disease: Secondary | ICD-10-CM

## 2019-06-20 ENCOUNTER — Ambulatory Visit: Payer: Self-pay

## 2019-06-20 ENCOUNTER — Other Ambulatory Visit: Payer: Self-pay

## 2019-06-22 ENCOUNTER — Encounter: Payer: Self-pay | Admitting: Internal Medicine

## 2019-09-10 ENCOUNTER — Other Ambulatory Visit: Payer: Self-pay | Admitting: Internal Medicine

## 2019-09-10 DIAGNOSIS — B20 Human immunodeficiency virus [HIV] disease: Secondary | ICD-10-CM

## 2019-09-13 ENCOUNTER — Other Ambulatory Visit: Payer: Self-pay

## 2019-09-13 DIAGNOSIS — B20 Human immunodeficiency virus [HIV] disease: Secondary | ICD-10-CM

## 2019-09-13 NOTE — Progress Notes (Unsigned)
21 

## 2019-09-14 LAB — T-HELPER CELL (CD4) - (RCID CLINIC ONLY)
CD4 % Helper T Cell: 43 % (ref 33–65)
CD4 T Cell Abs: 961 /uL (ref 400–1790)

## 2019-09-15 LAB — CBC
HCT: 40.6 % (ref 38.5–50.0)
Hemoglobin: 13.9 g/dL (ref 13.2–17.1)
MCH: 34.1 pg — ABNORMAL HIGH (ref 27.0–33.0)
MCHC: 34.2 g/dL (ref 32.0–36.0)
MCV: 99.5 fL (ref 80.0–100.0)
MPV: 11.3 fL (ref 7.5–12.5)
Platelets: 233 10*3/uL (ref 140–400)
RBC: 4.08 10*6/uL — ABNORMAL LOW (ref 4.20–5.80)
RDW: 12.2 % (ref 11.0–15.0)
WBC: 5.2 10*3/uL (ref 3.8–10.8)

## 2019-09-15 LAB — LIPID PANEL
Cholesterol: 226 mg/dL — ABNORMAL HIGH (ref <200)
HDL: 49 mg/dL (ref >=40)
LDL Cholesterol (Calc): 156 mg/dL (calc) — ABNORMAL HIGH
Non-HDL Cholesterol (Calc): 177 mg/dL (calc) — ABNORMAL HIGH (ref <130)
Total CHOL/HDL Ratio: 4.6 (calc) (ref <5.0)
Triglycerides: 99 mg/dL (ref <150)

## 2019-09-15 LAB — COMPREHENSIVE METABOLIC PANEL
AG Ratio: 1.3 (calc) (ref 1.0–2.5)
ALT: 8 U/L — ABNORMAL LOW (ref 9–46)
AST: 10 U/L (ref 10–40)
Albumin: 4.2 g/dL (ref 3.6–5.1)
Alkaline phosphatase (APISO): 46 U/L (ref 36–130)
BUN: 7 mg/dL (ref 7–25)
CO2: 27 mmol/L (ref 20–32)
Calcium: 9.5 mg/dL (ref 8.6–10.3)
Chloride: 104 mmol/L (ref 98–110)
Creat: 0.96 mg/dL (ref 0.60–1.35)
Globulin: 3.2 g/dL (calc) (ref 1.9–3.7)
Glucose, Bld: 78 mg/dL (ref 65–99)
Potassium: 3.8 mmol/L (ref 3.5–5.3)
Sodium: 140 mmol/L (ref 135–146)
Total Bilirubin: 0.7 mg/dL (ref 0.2–1.2)
Total Protein: 7.4 g/dL (ref 6.1–8.1)

## 2019-09-15 LAB — HIV-1 RNA QUANT-NO REFLEX-BLD
HIV 1 RNA Quant: <20 copies/mL
HIV-1 RNA Quant, Log: <1.3 Log copies/mL

## 2019-09-15 LAB — RPR: RPR Ser Ql: NONREACTIVE

## 2019-10-01 ENCOUNTER — Encounter: Payer: Self-pay | Admitting: Internal Medicine

## 2020-03-15 ENCOUNTER — Other Ambulatory Visit: Payer: Self-pay | Admitting: Internal Medicine

## 2020-03-15 DIAGNOSIS — B20 Human immunodeficiency virus [HIV] disease: Secondary | ICD-10-CM

## 2020-03-17 ENCOUNTER — Telehealth: Payer: Self-pay

## 2020-03-17 ENCOUNTER — Other Ambulatory Visit: Payer: Self-pay

## 2020-03-17 DIAGNOSIS — Z113 Encounter for screening for infections with a predominantly sexual mode of transmission: Secondary | ICD-10-CM

## 2020-03-17 DIAGNOSIS — B20 Human immunodeficiency virus [HIV] disease: Secondary | ICD-10-CM

## 2020-03-17 NOTE — Telephone Encounter (Signed)
Spoke with patient regarding his refill request for Symtuza. Patient advised he will need a follow up with Dr. Orvan Falconer, lab appointment and an appointment with Roe Coombs to renew RW and UMAP. Patient is aware his medications will not be covered until he comes in to renew UMAP. Patient scheduled for labs and with Roe Coombs on Friday 03/21/20. Patient stated he could only come on a Friday. Patient also scheduled with Dr. Orvan Falconer as well on 04/15/20. Patient requested his wife be scheduled on the same days as him and she has been scheduled. Patient has not missed any does of Symtuza and still has some  remaining.   Jyla Hopf T Pricilla Loveless

## 2020-03-21 ENCOUNTER — Other Ambulatory Visit: Payer: Self-pay

## 2020-03-21 ENCOUNTER — Ambulatory Visit: Payer: Self-pay

## 2020-03-21 DIAGNOSIS — B20 Human immunodeficiency virus [HIV] disease: Secondary | ICD-10-CM

## 2020-03-21 DIAGNOSIS — Z113 Encounter for screening for infections with a predominantly sexual mode of transmission: Secondary | ICD-10-CM

## 2020-03-21 LAB — T-HELPER CELL (CD4) - (RCID CLINIC ONLY)
CD4 % Helper T Cell: 47 % (ref 33–65)
CD4 T Cell Abs: 620 /uL (ref 400–1790)

## 2020-03-23 LAB — URINE CYTOLOGY ANCILLARY ONLY
Chlamydia: NEGATIVE
Comment: NEGATIVE
Comment: NORMAL
Neisseria Gonorrhea: NEGATIVE

## 2020-03-24 ENCOUNTER — Encounter: Payer: Self-pay | Admitting: Internal Medicine

## 2020-03-24 LAB — COMPLETE METABOLIC PANEL WITH GFR
AG Ratio: 1.3 (calc) (ref 1.0–2.5)
ALT: 10 U/L (ref 9–46)
AST: 16 U/L (ref 10–40)
Albumin: 4.5 g/dL (ref 3.6–5.1)
Alkaline phosphatase (APISO): 53 U/L (ref 36–130)
BUN: 8 mg/dL (ref 7–25)
CO2: 27 mmol/L (ref 20–32)
Calcium: 9.8 mg/dL (ref 8.6–10.3)
Chloride: 102 mmol/L (ref 98–110)
Creat: 0.87 mg/dL (ref 0.60–1.35)
GFR, Est African American: 119 mL/min/{1.73_m2} (ref >=60)
GFR, Est Non African American: 103 mL/min/{1.73_m2} (ref >=60)
Globulin: 3.5 g/dL (calc) (ref 1.9–3.7)
Glucose, Bld: 87 mg/dL (ref 65–99)
Potassium: 5.4 mmol/L — ABNORMAL HIGH (ref 3.5–5.3)
Sodium: 136 mmol/L (ref 135–146)
Total Bilirubin: 0.5 mg/dL (ref 0.2–1.2)
Total Protein: 8 g/dL (ref 6.1–8.1)

## 2020-03-24 LAB — CBC WITH DIFFERENTIAL/PLATELET
Absolute Monocytes: 422 cells/uL (ref 200–950)
Basophils Absolute: 52 cells/uL (ref 0–200)
Basophils Relative: 1.4 %
Eosinophils Absolute: 111 cells/uL (ref 15–500)
Eosinophils Relative: 3 %
HCT: 40.3 % (ref 38.5–50.0)
Hemoglobin: 13.8 g/dL (ref 13.2–17.1)
Lymphs Abs: 1480 cells/uL (ref 850–3900)
MCH: 33.7 pg — ABNORMAL HIGH (ref 27.0–33.0)
MCHC: 34.2 g/dL (ref 32.0–36.0)
MCV: 98.5 fL (ref 80.0–100.0)
MPV: 10.7 fL (ref 7.5–12.5)
Monocytes Relative: 11.4 %
Neutro Abs: 1635 cells/uL (ref 1500–7800)
Neutrophils Relative %: 44.2 %
Platelets: 241 10*3/uL (ref 140–400)
RBC: 4.09 10*6/uL — ABNORMAL LOW (ref 4.20–5.80)
RDW: 12.2 % (ref 11.0–15.0)
Total Lymphocyte: 40 %
WBC: 3.7 10*3/uL — ABNORMAL LOW (ref 3.8–10.8)

## 2020-03-24 LAB — HIV-1 RNA QUANT-NO REFLEX-BLD
HIV 1 RNA Quant: 1590 Copies/mL — ABNORMAL HIGH
HIV-1 RNA Quant, Log: 3.2 Log cps/mL — ABNORMAL HIGH

## 2020-03-24 LAB — RPR: RPR Ser Ql: NONREACTIVE

## 2020-03-28 ENCOUNTER — Other Ambulatory Visit: Payer: Self-pay

## 2020-04-15 ENCOUNTER — Ambulatory Visit (INDEPENDENT_AMBULATORY_CARE_PROVIDER_SITE_OTHER): Payer: Self-pay | Admitting: Internal Medicine

## 2020-04-15 ENCOUNTER — Encounter: Payer: Self-pay | Admitting: Internal Medicine

## 2020-04-15 ENCOUNTER — Other Ambulatory Visit: Payer: Self-pay

## 2020-04-15 VITALS — BP 126/82 | HR 89 | Temp 97.9°F | Wt 153.0 lb

## 2020-04-15 DIAGNOSIS — F17210 Nicotine dependence, cigarettes, uncomplicated: Secondary | ICD-10-CM

## 2020-04-15 DIAGNOSIS — F331 Major depressive disorder, recurrent, moderate: Secondary | ICD-10-CM

## 2020-04-15 DIAGNOSIS — Z23 Encounter for immunization: Secondary | ICD-10-CM

## 2020-04-15 DIAGNOSIS — B20 Human immunodeficiency virus [HIV] disease: Secondary | ICD-10-CM

## 2020-04-15 MED ORDER — SYMTUZA 800-150-200-10 MG PO TABS: 1.0000 | ORAL_TABLET | Freq: Every day | ORAL | 11 refills | Status: DC

## 2020-04-15 NOTE — Assessment & Plan Note (Signed)
I encouraged him to continue regular visits with our behavioral health counselor. 

## 2020-04-15 NOTE — Assessment & Plan Note (Signed)
His viral load has reactivated.  I talked to him about the utmost importance of not missing doses of Symtuza.  I will get a repeat viral load today with genotype and integrase resistance assays.  He will follow-up after blood work in 6 months.  He received his influenza vaccine here today.

## 2020-04-15 NOTE — Addendum Note (Signed)
Addended by: Andree Coss on: 04/15/2020 03:57 PM   Modules accepted: Orders
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
## 2020-04-15 NOTE — Assessment & Plan Note (Signed)
I talked to him again about the importance of cigarette cessation. 

## 2020-05-02 LAB — HIV-1 RNA QUANT-NO REFLEX-BLD
HIV 1 RNA Quant: 33 Copies/mL — ABNORMAL HIGH
HIV-1 RNA Quant, Log: 1.51 Log cps/mL — ABNORMAL HIGH

## 2020-05-02 LAB — HIV-1 INTEGRASE GENOTYPE

## 2020-05-02 LAB — HIV-1 GENOTYPE

## 2020-12-17 ENCOUNTER — Other Ambulatory Visit: Payer: Self-pay

## 2020-12-17 ENCOUNTER — Encounter: Payer: Self-pay | Admitting: Internal Medicine

## 2020-12-17 ENCOUNTER — Ambulatory Visit (INDEPENDENT_AMBULATORY_CARE_PROVIDER_SITE_OTHER): Payer: Self-pay | Admitting: Internal Medicine

## 2020-12-17 DIAGNOSIS — F17210 Nicotine dependence, cigarettes, uncomplicated: Secondary | ICD-10-CM

## 2020-12-17 DIAGNOSIS — S0193XD Puncture wound without foreign body of unspecified part of head, subsequent encounter: Secondary | ICD-10-CM

## 2020-12-17 DIAGNOSIS — F330 Major depressive disorder, recurrent, mild: Secondary | ICD-10-CM

## 2020-12-17 DIAGNOSIS — B20 Human immunodeficiency virus [HIV] disease: Secondary | ICD-10-CM

## 2020-12-17 MED ORDER — SYMTUZA 800-150-200-10 MG PO TABS: 1.0000 | ORAL_TABLET | Freq: Every day | ORAL | 11 refills | Status: DC

## 2020-12-17 NOTE — Assessment & Plan Note (Signed)
His depression is still active but very mild and improved.

## 2020-12-17 NOTE — Assessment & Plan Note (Signed)
Encouraged him again to make every attempt to cut down and quit smoking.
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
## 2020-12-17 NOTE — Assessment & Plan Note (Signed)
I encouraged him to reschedule a visit with the [facility] dental clinic to discuss options for oral reconstruction.

## 2020-12-17 NOTE — Assessment & Plan Note (Signed)
His infection came under much better control last fall after restarting Symtuza.  He will continue it and get repeat blood work today.  He will follow-up in 6 months.

## 2020-12-18 LAB — T-HELPER CELL (CD4) - (RCID CLINIC ONLY)
CD4 % Helper T Cell: 46 % (ref 33–65)
CD4 T Cell Abs: 683 /uL (ref 400–1790)

## 2020-12-19 LAB — COMPREHENSIVE METABOLIC PANEL
AG Ratio: 1.3 (calc) (ref 1.0–2.5)
ALT: 22 U/L (ref 9–46)
AST: 19 U/L (ref 10–40)
Albumin: 4.5 g/dL (ref 3.6–5.1)
Alkaline phosphatase (APISO): 44 U/L (ref 36–130)
BUN: 11 mg/dL (ref 7–25)
CO2: 29 mmol/L (ref 20–32)
Calcium: 10.1 mg/dL (ref 8.6–10.3)
Chloride: 104 mmol/L (ref 98–110)
Creat: 1.04 mg/dL (ref 0.60–1.35)
Globulin: 3.5 g/dL (calc) (ref 1.9–3.7)
Glucose, Bld: 78 mg/dL (ref 65–99)
Potassium: 4.4 mmol/L (ref 3.5–5.3)
Sodium: 140 mmol/L (ref 135–146)
Total Bilirubin: 0.5 mg/dL (ref 0.2–1.2)
Total Protein: 8 g/dL (ref 6.1–8.1)

## 2020-12-19 LAB — HIV-1 RNA QUANT-NO REFLEX-BLD
HIV 1 RNA Quant: NOT DETECTED Copies/mL
HIV-1 RNA Quant, Log: NOT DETECTED Log cps/mL

## 2020-12-19 LAB — LIPID PANEL
Cholesterol: 188 mg/dL (ref <200)
HDL: 55 mg/dL (ref >=40)
LDL Cholesterol (Calc): 110 mg/dL (calc) — ABNORMAL HIGH
Non-HDL Cholesterol (Calc): 133 mg/dL (calc) — ABNORMAL HIGH (ref <130)
Total CHOL/HDL Ratio: 3.4 (calc) (ref <5.0)
Triglycerides: 115 mg/dL (ref <150)

## 2020-12-19 LAB — CBC
HCT: 42.3 % (ref 38.5–50.0)
Hemoglobin: 14.4 g/dL (ref 13.2–17.1)
MCH: 34.5 pg — ABNORMAL HIGH (ref 27.0–33.0)
MCHC: 34 g/dL (ref 32.0–36.0)
MCV: 101.4 fL — ABNORMAL HIGH (ref 80.0–100.0)
MPV: 12 fL (ref 7.5–12.5)
Platelets: 232 10*3/uL (ref 140–400)
RBC: 4.17 10*6/uL — ABNORMAL LOW (ref 4.20–5.80)
RDW: 11.8 % (ref 11.0–15.0)
WBC: 6.1 10*3/uL (ref 3.8–10.8)

## 2020-12-19 LAB — RPR: RPR Ser Ql: NONREACTIVE

## 2020-12-26 ENCOUNTER — Ambulatory Visit: Payer: Self-pay

## 2020-12-26 ENCOUNTER — Other Ambulatory Visit: Payer: Self-pay

## 2021-05-26 ENCOUNTER — Other Ambulatory Visit (HOSPITAL_COMMUNITY): Payer: Self-pay

## 2021-05-26 ENCOUNTER — Telehealth: Payer: Self-pay

## 2021-05-26 NOTE — Telephone Encounter (Signed)
Received voicemail from [facility] wanting to know if they can fill the patient's Symtuza. They state the patient has not filled this medication since November 2021. They received a new prescription for it in July 2022, but the patient has never filled this.   RN called patient to assess adherence, no answer. Left HIPAA compliant voicemail requesting callback.   Financial counselor to check on SPAP status.   Sandie Ano, RN

## 2021-05-26 NOTE — Telephone Encounter (Signed)
We can give him a sample until he apply for UMAP or ADAP

## 2021-05-27 ENCOUNTER — Ambulatory Visit: Payer: Self-pay

## 2021-05-27 ENCOUNTER — Other Ambulatory Visit: Payer: Self-pay

## 2021-05-28 ENCOUNTER — Encounter: Payer: Self-pay | Admitting: Internal Medicine

## 2021-06-17 ENCOUNTER — Other Ambulatory Visit: Payer: Self-pay

## 2021-06-17 DIAGNOSIS — Z113 Encounter for screening for infections with a predominantly sexual mode of transmission: Secondary | ICD-10-CM

## 2021-06-17 DIAGNOSIS — B20 Human immunodeficiency virus [HIV] disease: Secondary | ICD-10-CM

## 2021-06-19 ENCOUNTER — Other Ambulatory Visit: Payer: Medicare (Managed Care)

## 2021-06-19 ENCOUNTER — Ambulatory Visit: Payer: Medicare (Managed Care)

## 2021-06-19 ENCOUNTER — Other Ambulatory Visit: Payer: Self-pay

## 2021-06-19 DIAGNOSIS — B20 Human immunodeficiency virus [HIV] disease: Secondary | ICD-10-CM

## 2021-06-19 DIAGNOSIS — Z113 Encounter for screening for infections with a predominantly sexual mode of transmission: Secondary | ICD-10-CM

## 2021-06-22 LAB — COMPLETE METABOLIC PANEL WITH GFR
AG Ratio: 1.3 (calc) (ref 1.0–2.5)
ALT: 13 U/L (ref 9–46)
AST: 15 U/L (ref 10–40)
Albumin: 4.4 g/dL (ref 3.6–5.1)
Alkaline phosphatase (APISO): 41 U/L (ref 36–130)
BUN: 9 mg/dL (ref 7–25)
CO2: 28 mmol/L (ref 20–32)
Calcium: 10.3 mg/dL (ref 8.6–10.3)
Chloride: 105 mmol/L (ref 98–110)
Creat: 1.05 mg/dL (ref 0.60–1.29)
Globulin: 3.5 g/dL (calc) (ref 1.9–3.7)
Glucose, Bld: 82 mg/dL (ref 65–99)
Potassium: 4.5 mmol/L (ref 3.5–5.3)
Sodium: 140 mmol/L (ref 135–146)
Total Bilirubin: 0.8 mg/dL (ref 0.2–1.2)
Total Protein: 7.9 g/dL (ref 6.1–8.1)
eGFR: 88 mL/min/{1.73_m2} (ref >=60)

## 2021-06-22 LAB — CBC WITH DIFFERENTIAL/PLATELET
Absolute Monocytes: 630 cells/uL (ref 200–950)
Basophils Absolute: 47 cells/uL (ref 0–200)
Basophils Relative: 0.7 %
Eosinophils Absolute: 201 cells/uL (ref 15–500)
Eosinophils Relative: 3 %
HCT: 43.5 % (ref 38.5–50.0)
Hemoglobin: 14.7 g/dL (ref 13.2–17.1)
Lymphs Abs: 1715 cells/uL (ref 850–3900)
MCH: 33.9 pg — ABNORMAL HIGH (ref 27.0–33.0)
MCHC: 33.8 g/dL (ref 32.0–36.0)
MCV: 100.2 fL — ABNORMAL HIGH (ref 80.0–100.0)
MPV: 11.8 fL (ref 7.5–12.5)
Monocytes Relative: 9.4 %
Neutro Abs: 4107 cells/uL (ref 1500–7800)
Neutrophils Relative %: 61.3 %
Platelets: 208 10*3/uL (ref 140–400)
RBC: 4.34 10*6/uL (ref 4.20–5.80)
RDW: 11.7 % (ref 11.0–15.0)
Total Lymphocyte: 25.6 %
WBC: 6.7 10*3/uL (ref 3.8–10.8)

## 2021-06-22 LAB — HIV-1 RNA QUANT-NO REFLEX-BLD
HIV 1 RNA Quant: NOT DETECTED Copies/mL
HIV-1 RNA Quant, Log: NOT DETECTED Log cps/mL

## 2021-06-22 LAB — T-HELPER CELLS (CD4) COUNT (NOT AT ARMC)
Absolute CD4: 833 cells/uL (ref 490–1740)
CD4 T Helper %: 47 % (ref 30–61)
Total lymphocyte count: 1764 cells/uL (ref 850–3900)

## 2021-07-07 ENCOUNTER — Encounter: Payer: Self-pay | Admitting: Internal Medicine

## 2022-01-06 ENCOUNTER — Other Ambulatory Visit: Payer: Self-pay | Admitting: Internal Medicine

## 2022-01-06 DIAGNOSIS — B20 Human immunodeficiency virus [HIV] disease: Secondary | ICD-10-CM

## 2022-01-07 ENCOUNTER — Telehealth: Payer: Self-pay

## 2022-01-07 NOTE — Telephone Encounter (Signed)
Received refill request for Symtuza, patient overdue for followup. Called to schedule, no answer. Left HIPAA compliant voicemail requesting callback.   Sandie Ano, RN

## 2022-02-12 ENCOUNTER — Other Ambulatory Visit: Payer: Self-pay | Admitting: Internal Medicine

## 2022-02-12 DIAGNOSIS — B20 Human immunodeficiency virus [HIV] disease: Secondary | ICD-10-CM

## 2022-02-23 ENCOUNTER — Other Ambulatory Visit: Payer: Self-pay | Admitting: Internal Medicine

## 2022-02-23 DIAGNOSIS — B20 Human immunodeficiency virus [HIV] disease: Secondary | ICD-10-CM

## 2022-03-11 ENCOUNTER — Other Ambulatory Visit: Payer: Self-pay

## 2022-03-11 DIAGNOSIS — Z79899 Other long term (current) drug therapy: Secondary | ICD-10-CM

## 2022-03-11 DIAGNOSIS — B20 Human immunodeficiency virus [HIV] disease: Secondary | ICD-10-CM

## 2022-03-11 DIAGNOSIS — Z113 Encounter for screening for infections with a predominantly sexual mode of transmission: Secondary | ICD-10-CM

## 2022-03-12 ENCOUNTER — Other Ambulatory Visit: Payer: Medicare (Managed Care)

## 2022-03-12 ENCOUNTER — Encounter: Payer: Self-pay | Admitting: Internal Medicine

## 2022-03-12 ENCOUNTER — Other Ambulatory Visit: Payer: Self-pay

## 2022-03-12 ENCOUNTER — Ambulatory Visit: Payer: Medicare (Managed Care)

## 2022-03-12 ENCOUNTER — Ambulatory Visit (INDEPENDENT_AMBULATORY_CARE_PROVIDER_SITE_OTHER): Payer: Medicare (Managed Care)

## 2022-03-12 DIAGNOSIS — Z113 Encounter for screening for infections with a predominantly sexual mode of transmission: Secondary | ICD-10-CM

## 2022-03-12 DIAGNOSIS — Z79899 Other long term (current) drug therapy: Secondary | ICD-10-CM

## 2022-03-12 DIAGNOSIS — Z23 Encounter for immunization: Secondary | ICD-10-CM | POA: Diagnosis not present

## 2022-03-12 DIAGNOSIS — B20 Human immunodeficiency virus [HIV] disease: Secondary | ICD-10-CM

## 2022-03-12 NOTE — Addendum Note (Signed)
Addended by: Daisy Floro T on: 03/12/2022 11:19 AM   Modules accepted: Orders

## 2022-03-13 LAB — C. TRACHOMATIS/N. GONORRHOEAE RNA
C. trachomatis RNA, TMA: NOT DETECTED
N. gonorrhoeae RNA, TMA: NOT DETECTED

## 2022-03-16 LAB — CBC WITH DIFFERENTIAL/PLATELET
Absolute Monocytes: 630 cells/uL (ref 200–950)
Basophils Absolute: 60 cells/uL (ref 0–200)
Basophils Relative: 0.9 %
Eosinophils Absolute: 141 cells/uL (ref 15–500)
Eosinophils Relative: 2.1 %
HCT: 39.7 % (ref 38.5–50.0)
Hemoglobin: 13.4 g/dL (ref 13.2–17.1)
Lymphs Abs: 1675 cells/uL (ref 850–3900)
MCH: 32.8 pg (ref 27.0–33.0)
MCHC: 33.8 g/dL (ref 32.0–36.0)
MCV: 97.3 fL (ref 80.0–100.0)
MPV: 11.3 fL (ref 7.5–12.5)
Monocytes Relative: 9.4 %
Neutro Abs: 4194 cells/uL (ref 1500–7800)
Neutrophils Relative %: 62.6 %
Platelets: 235 10*3/uL (ref 140–400)
RBC: 4.08 10*6/uL — ABNORMAL LOW (ref 4.20–5.80)
RDW: 12.3 % (ref 11.0–15.0)
Total Lymphocyte: 25 %
WBC: 6.7 10*3/uL (ref 3.8–10.8)

## 2022-03-16 LAB — COMPLETE METABOLIC PANEL WITH GFR
AG Ratio: 1.2 (calc) (ref 1.0–2.5)
ALT: 13 U/L (ref 9–46)
AST: 13 U/L (ref 10–40)
Albumin: 4.1 g/dL (ref 3.6–5.1)
Alkaline phosphatase (APISO): 48 U/L (ref 36–130)
BUN: 10 mg/dL (ref 7–25)
CO2: 31 mmol/L (ref 20–32)
Calcium: 9.3 mg/dL (ref 8.6–10.3)
Chloride: 105 mmol/L (ref 98–110)
Creat: 0.81 mg/dL (ref 0.60–1.29)
Globulin: 3.4 g/dL (calc) (ref 1.9–3.7)
Glucose, Bld: 85 mg/dL (ref 65–99)
Potassium: 3.4 mmol/L — ABNORMAL LOW (ref 3.5–5.3)
Sodium: 143 mmol/L (ref 135–146)
Total Bilirubin: 0.5 mg/dL (ref 0.2–1.2)
Total Protein: 7.5 g/dL (ref 6.1–8.1)
eGFR: 108 mL/min/{1.73_m2} (ref >=60)

## 2022-03-16 LAB — LIPID PANEL
Cholesterol: 198 mg/dL (ref <200)
HDL: 49 mg/dL (ref >=40)
LDL Cholesterol (Calc): 130 mg/dL (calc) — ABNORMAL HIGH
Non-HDL Cholesterol (Calc): 149 mg/dL (calc) — ABNORMAL HIGH (ref <130)
Total CHOL/HDL Ratio: 4 (calc) (ref <5.0)
Triglycerides: 87 mg/dL (ref <150)

## 2022-03-16 LAB — RPR: RPR Ser Ql: NONREACTIVE

## 2022-03-16 LAB — T-HELPER CELLS (CD4) COUNT (NOT AT ARMC)
Absolute CD4: 729 cells/uL (ref 490–1740)
CD4 T Helper %: 47 % (ref 30–61)
Total lymphocyte count: 1548 cells/uL (ref 850–3900)

## 2022-03-16 LAB — HIV-1 RNA QUANT-NO REFLEX-BLD
HIV 1 RNA Quant: NOT DETECTED Copies/mL
HIV-1 RNA Quant, Log: NOT DETECTED Log cps/mL

## 2022-03-25 ENCOUNTER — Ambulatory Visit: Payer: Medicare (Managed Care) | Admitting: Internal Medicine

## 2022-03-26 ENCOUNTER — Encounter: Payer: Self-pay | Admitting: Family

## 2022-03-26 ENCOUNTER — Other Ambulatory Visit: Payer: Self-pay

## 2022-03-26 ENCOUNTER — Ambulatory Visit (INDEPENDENT_AMBULATORY_CARE_PROVIDER_SITE_OTHER): Payer: Medicare (Managed Care) | Admitting: Family

## 2022-03-26 VITALS — BP 109/71 | HR 69 | Resp 16 | Ht 75.0 in | Wt 162.2 lb

## 2022-03-26 DIAGNOSIS — Z0000 Encounter for general adult medical examination without abnormal findings: Secondary | ICD-10-CM | POA: Diagnosis not present

## 2022-03-26 DIAGNOSIS — B20 Human immunodeficiency virus [HIV] disease: Secondary | ICD-10-CM

## 2022-03-26 DIAGNOSIS — Z23 Encounter for immunization: Secondary | ICD-10-CM

## 2022-03-26 MED ORDER — SYMTUZA 800-150-200-10 MG PO TABS: 1.0000 | ORAL_TABLET | Freq: Every day | ORAL | 6 refills | Status: DC

## 2022-03-26 NOTE — Assessment & Plan Note (Signed)
Tyeson continues to have well-controlled virus with good adherence and tolerance to [employer].  Reviewed lab work and discussed plan of care.  Continue current dose of Symtuza.  Financial assistance renewed and up-to-date.  We will plan for follow-up in 3 to 4 months to renew financial assistance and meet with provider to limit transportation concerns.

## 2022-03-26 NOTE — Assessment & Plan Note (Signed)
   Discussed importance of safe sexual practices and condom use.  Condoms and STD testing offered.  Prevnar 20 updated  Information to make routine dental care appointment provided in after visit summary.  Due for colon cancer screening through colonoscopy.
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
## 2022-03-26 NOTE — Patient Instructions (Addendum)
Nice to see you.  Continue to take your medication daily as prescribed.  Refills have been sent to the pharmacy.  Please call [facility] ([facility] to schedule/follow up on your dental care at [phone]  Plan for follow up in 4 months or sooner if needed with lab work on the same day.  Have a great day and stay safe!  

## 2022-08-06 ENCOUNTER — Ambulatory Visit: Payer: Medicare (Managed Care) | Admitting: Family

## 2022-08-19 ENCOUNTER — Other Ambulatory Visit: Payer: Self-pay

## 2022-08-19 ENCOUNTER — Encounter: Payer: Self-pay | Admitting: Family

## 2022-08-19 ENCOUNTER — Ambulatory Visit (INDEPENDENT_AMBULATORY_CARE_PROVIDER_SITE_OTHER): Payer: Medicare (Managed Care) | Admitting: Family

## 2022-08-19 VITALS — BP 123/79 | HR 65 | Temp 98.1°F | Wt 164.4 lb

## 2022-08-19 DIAGNOSIS — Z23 Encounter for immunization: Secondary | ICD-10-CM | POA: Diagnosis not present

## 2022-08-19 DIAGNOSIS — F17210 Nicotine dependence, cigarettes, uncomplicated: Secondary | ICD-10-CM | POA: Diagnosis not present

## 2022-08-19 DIAGNOSIS — B20 Human immunodeficiency virus [HIV] disease: Secondary | ICD-10-CM | POA: Diagnosis not present

## 2022-08-19 DIAGNOSIS — Z0000 Encounter for general adult medical examination without abnormal findings: Secondary | ICD-10-CM

## 2022-08-19 MED ORDER — SYMTUZA 800-150-200-10 MG PO TABS: 1.0000 | ORAL_TABLET | Freq: Every day | ORAL | 6 refills | Status: DC

## 2022-08-19 NOTE — Patient Instructions (Signed)
Nice to see you.  We will check your lab work today.  Continue to take your medication daily as prescribed.  Refills have been sent to the pharmacy.  Plan for follow up in 6 months or sooner if needed with lab work on the same day.  Have a great day and stay safe!  
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
## 2022-08-20 ENCOUNTER — Encounter: Payer: Self-pay | Admitting: Family

## 2022-08-20 LAB — T-HELPER CELL (CD4) - (RCID CLINIC ONLY)
CD4 % Helper T Cell: 48 % (ref 33–65)
CD4 T Cell Abs: 771 /uL (ref 400–1790)

## 2022-08-20 NOTE — Assessment & Plan Note (Signed)
Carl Schultz continues to smoke daily and is in the precontemplation stage of quitting and not ready to quit at this time.  Reminded of the increased risk for cardiovascular, respiratory, and malignant disease in the future.

## 2022-08-20 NOTE — Assessment & Plan Note (Signed)
Malloy continues to have well-controlled virus with good adherence and tolerance to [employer].  Reviewed previous lab work and discussed plan of care.  Check lab work today.  Continue current dose of Symtuza.  Plan for follow-up in 6 months or sooner if needed with lab work on the same day.

## 2022-08-20 NOTE — Assessment & Plan Note (Signed)
Discussed importance of safe sexual practice and condom use. Condoms and STD testing offered.  Menveo updated Due for colon cancer screening through colonoscopy.

## 2022-08-22 LAB — COMPLETE METABOLIC PANEL WITH GFR
AG Ratio: 1.1 (calc) (ref 1.0–2.5)
ALT: 14 U/L (ref 9–46)
AST: 15 U/L (ref 10–35)
Albumin: 4.1 g/dL (ref 3.6–5.1)
Alkaline phosphatase (APISO): 46 U/L (ref 35–144)
BUN/Creatinine Ratio: 5 (calc) — ABNORMAL LOW (ref 6–22)
BUN: 5 mg/dL — ABNORMAL LOW (ref 7–25)
CO2: 25 mmol/L (ref 20–32)
Calcium: 9.3 mg/dL (ref 8.6–10.3)
Chloride: 105 mmol/L (ref 98–110)
Creat: 0.93 mg/dL (ref 0.70–1.30)
Globulin: 3.7 g/dL (calc) (ref 1.9–3.7)
Glucose, Bld: 78 mg/dL (ref 65–99)
Potassium: 3.8 mmol/L (ref 3.5–5.3)
Sodium: 139 mmol/L (ref 135–146)
Total Bilirubin: 0.7 mg/dL (ref 0.2–1.2)
Total Protein: 7.8 g/dL (ref 6.1–8.1)
eGFR: 100 mL/min/{1.73_m2} (ref >=60)

## 2022-08-22 LAB — HIV-1 RNA QUANT-NO REFLEX-BLD
HIV 1 RNA Quant: NOT DETECTED Copies/mL
HIV-1 RNA Quant, Log: NOT DETECTED Log cps/mL

## 2022-11-25 NOTE — Progress Notes (Signed)
The 10-year ASCVD risk score (Arnett DK, et al., 2019) is: 8.4%   Values used to calculate the score:     Age: 52 years     Sex: Male     Is Non-Hispanic African American: Yes     Diabetic: No     Tobacco smoker: Yes     Systolic Blood Pressure: 123 mmHg     Is BP treated: No     HDL Cholesterol: 49 mg/dL     Total Cholesterol: 198 mg/dL  Sandie Ano, RN

## 2023-02-15 ENCOUNTER — Other Ambulatory Visit: Payer: Self-pay

## 2023-02-15 ENCOUNTER — Encounter: Payer: Self-pay | Admitting: Family

## 2023-02-15 ENCOUNTER — Ambulatory Visit (INDEPENDENT_AMBULATORY_CARE_PROVIDER_SITE_OTHER): Payer: Medicare (Managed Care) | Admitting: Family

## 2023-02-15 VITALS — Resp 16 | Ht 75.0 in | Wt 158.6 lb

## 2023-02-15 DIAGNOSIS — B20 Human immunodeficiency virus [HIV] disease: Secondary | ICD-10-CM | POA: Diagnosis not present

## 2023-02-15 DIAGNOSIS — Z79899 Other long term (current) drug therapy: Secondary | ICD-10-CM

## 2023-02-15 DIAGNOSIS — Z23 Encounter for immunization: Secondary | ICD-10-CM | POA: Diagnosis not present

## 2023-02-15 DIAGNOSIS — Z0000 Encounter for general adult medical examination without abnormal findings: Secondary | ICD-10-CM

## 2023-02-15 DIAGNOSIS — Z9189 Other specified personal risk factors, not elsewhere classified: Secondary | ICD-10-CM | POA: Diagnosis not present

## 2023-02-15 MED ORDER — SYMTUZA 800-150-200-10 MG PO TABS: 1.0000 | ORAL_TABLET | Freq: Every day | ORAL | 6 refills | Status: DC

## 2023-02-15 NOTE — Assessment & Plan Note (Signed)
Carl Schultz continues to have well-controlled virus with good adherence and tolerance to Symtuza.  Reviewed previous lab work and discussed plan of care.  Check blood work today.  Meet with Artist.  Continue current dose of Symtuza.  Plan for follow-up in 6 months or sooner if needed with lab work on the same day.

## 2023-02-15 NOTE — Patient Instructions (Addendum)
Nice to see you. ? ?We will check your lab work today. ? ?Continue to take your medication daily as prescribed. ? ?Refills have been sent to the pharmacy. ? ?Plan for follow up in 6 months or sooner if needed with lab work on the same day. ? ?Have a great day and stay safe! ? ?

## 2023-02-15 NOTE — Assessment & Plan Note (Signed)
Discussed importance of safe sexual practice and condom use. Condoms and STD testing offered.  Due for colon cancer screening with referral for colonoscopy placed.  Influenza vaccination updated.
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
## 2023-02-16 LAB — COMPLETE METABOLIC PANEL WITH GFR
AG Ratio: 1.3 (calc) (ref 1.0–2.5)
ALT: 15 U/L (ref 9–46)
AST: 14 U/L (ref 10–35)
Albumin: 4.3 g/dL (ref 3.6–5.1)
Alkaline phosphatase (APISO): 41 U/L (ref 35–144)
BUN: 12 mg/dL (ref 7–25)
CO2: 27 mmol/L (ref 20–32)
Calcium: 9.7 mg/dL (ref 8.6–10.3)
Chloride: 105 mmol/L (ref 98–110)
Creat: 1.08 mg/dL (ref 0.70–1.30)
Globulin: 3.4 g/dL (ref 1.9–3.7)
Glucose, Bld: 87 mg/dL (ref 65–139)
Potassium: 4.2 mmol/L (ref 3.5–5.3)
Sodium: 140 mmol/L (ref 135–146)
Total Bilirubin: 0.5 mg/dL (ref 0.2–1.2)
Total Protein: 7.7 g/dL (ref 6.1–8.1)
eGFR: 84 mL/min/{1.73_m2} (ref >=60)

## 2023-02-16 LAB — HIV-1 RNA QUANT-NO REFLEX-BLD
HIV 1 RNA Quant: <20 {copies}/mL — ABNORMAL HIGH
HIV-1 RNA Quant, Log: <1.3 {Log_copies}/mL — ABNORMAL HIGH

## 2023-02-16 LAB — LIPID PANEL
Cholesterol: 234 mg/dL — ABNORMAL HIGH (ref <200)
HDL: 59 mg/dL (ref >=40)
LDL Cholesterol (Calc): 157 mg/dL — ABNORMAL HIGH
Non-HDL Cholesterol (Calc): 175 mg/dL — ABNORMAL HIGH (ref <130)
Total CHOL/HDL Ratio: 4 (calc) (ref <5.0)
Triglycerides: 79 mg/dL (ref <150)

## 2023-02-16 LAB — T-HELPER CELL (CD4) - (RCID CLINIC ONLY)
CD4 % Helper T Cell: 45 % (ref 33–65)
CD4 T Cell Abs: 679 /uL (ref 400–1790)

## 2023-03-03 LAB — COLOGUARD: COLOGUARD: POSITIVE — AB

## 2023-03-03 LAB — EXTERNAL GENERIC LAB PROCEDURE: COLOGUARD: POSITIVE — AB

## 2023-04-06 ENCOUNTER — Ambulatory Visit (AMBULATORY_SURGERY_CENTER): Payer: Medicare (Managed Care) | Admitting: *Deleted

## 2023-04-06 VITALS — Ht 75.0 in | Wt 146.0 lb

## 2023-04-06 DIAGNOSIS — Z1211 Encounter for screening for malignant neoplasm of colon: Secondary | ICD-10-CM

## 2023-04-06 MED ORDER — NA SULFATE-K SULFATE-MG SULF 17.5-3.13-1.6 GM/177ML PO SOLN: 1.0000 | Freq: Once | ORAL | 0 refills | Status: AC

## 2023-04-06 NOTE — Progress Notes (Signed)
Pt's name and DOB verified at the beginning of the pre-visit wit 2 identifiers  Pt denies any difficulty with ambulating,sitting, laying down or rolling side to side  Gave both [facility] main # and MD on call # prior to instructions.   No egg or soy allergy known to patient   No issues known to pt with past sedation with any surgeries or procedures  Pt denies having issues being intubated  Pt has no issues moving head neck or swallowing  No FH of Malignant Hyperthermia  Pt is not on diet pills or shots  Pt is not on home 02   Pt is not on blood thinners   Pt denies issues with constipation   Pt is not on dialysis  Pt denise any abnormal heart rhythms   Pt denies any upcoming cardiac testing  Pt encouraged to use to use Singlecare or Goodrx to reduce cost   Patient's chart reviewed by Cathlyn Parsons CNRA prior to pre-visit and patient appropriate for the [facility].  Pre-visit completed and red dot placed by patient's name on their procedure day (on provider's schedule).  .  Visit by phone  Pt states weight is 146 lb  Instructed pt why it is important to and  to call if they have any changes in health or new medications. Directed them to the # given and on instructions.     Instructions reviewed with pt and pt states understanding. Instructed to review again prior to procedure. Pt states they will.   Instructions sent by mail with coupon and by my chart

## 2023-04-19 ENCOUNTER — Telehealth: Payer: Self-pay | Admitting: Gastroenterology

## 2023-04-19 NOTE — Telephone Encounter (Signed)
Good afternoon Dr. Russella Dar,   We received a call from this patient wishing to reschedule his procedure scheduled for tomorrow at 11:30 AM.  Patient states his car broke down this morning and has no means of transportation, patient was rescheduled for 12/2 at 2:30 PM.   Thank you.

## 2023-04-20 ENCOUNTER — Encounter: Payer: Medicare (Managed Care) | Admitting: Gastroenterology

## 2023-05-05 ENCOUNTER — Encounter: Payer: Self-pay | Admitting: Gastroenterology

## 2023-05-15 ENCOUNTER — Encounter: Payer: Self-pay | Admitting: Certified Registered Nurse Anesthetist

## 2023-05-16 ENCOUNTER — Telehealth: Payer: Self-pay | Admitting: Gastroenterology

## 2023-05-16 ENCOUNTER — Encounter: Payer: Medicare (Managed Care) | Admitting: Gastroenterology

## 2023-05-16 NOTE — Telephone Encounter (Signed)
Inbound call from patient stating he would like to cancel today's colonoscopy at 2:30 due to lack of transportation. States he thought he had transportation for today but care partner is unable to take him. States once he moves to [place] he will reschedule. Please advise, thank you.

## 2023-05-16 NOTE — Telephone Encounter (Signed)
Reschedule colonoscopy when convenient.

## 2023-08-29 ENCOUNTER — Ambulatory Visit (INDEPENDENT_AMBULATORY_CARE_PROVIDER_SITE_OTHER): Payer: Medicare (Managed Care) | Admitting: Family

## 2023-08-29 ENCOUNTER — Encounter: Payer: Self-pay | Admitting: Family

## 2023-08-29 ENCOUNTER — Other Ambulatory Visit: Payer: Self-pay

## 2023-08-29 VITALS — BP 127/78 | HR 103 | Temp 98.1°F | Ht 75.0 in | Wt 153.0 lb

## 2023-08-29 DIAGNOSIS — Z23 Encounter for immunization: Secondary | ICD-10-CM

## 2023-08-29 DIAGNOSIS — F17210 Nicotine dependence, cigarettes, uncomplicated: Secondary | ICD-10-CM | POA: Diagnosis not present

## 2023-08-29 DIAGNOSIS — Z9189 Other specified personal risk factors, not elsewhere classified: Secondary | ICD-10-CM | POA: Diagnosis not present

## 2023-08-29 DIAGNOSIS — B20 Human immunodeficiency virus [HIV] disease: Secondary | ICD-10-CM | POA: Diagnosis not present

## 2023-08-29 DIAGNOSIS — Z0000 Encounter for general adult medical examination without abnormal findings: Secondary | ICD-10-CM

## 2023-08-29 MED ORDER — ROSUVASTATIN CALCIUM 10 MG PO TABS: 10.0000 mg | ORAL_TABLET | Freq: Every day | ORAL | 5 refills | Status: AC

## 2023-08-29 MED ORDER — SYMTUZA 800-150-200-10 MG PO TABS: 1.0000 | ORAL_TABLET | Freq: Every day | ORAL | 6 refills | Status: DC

## 2023-08-29 NOTE — Assessment & Plan Note (Signed)
 Discussed importance of safe sexual practice and condom use. Condoms and site specific STD testing offered.  Vaccinations reviewed -  Menveo updated.  Colonoscopy pending  Referral placed for routine dental care to [facility].

## 2023-08-29 NOTE — Assessment & Plan Note (Signed)
 Carl Schultz is at increased risk for cardiovascular disease with ASCVD score of 8.8.  Start rosuvastatin.  Discussed possible side effects including myalgias of medication.  Will need to continue low-dose statin medication secondary to Symtuza.

## 2023-08-29 NOTE — Assessment & Plan Note (Signed)
 Mr. Reyez continues to smoke tobacco daily.  Counseled on continued risk of tobacco use and methods for cessation.  Will need to obtain pack-year history toi Will need to obtain pack year history to determine eligibility for lung cancer screening. Resources provided in AVS. l

## 2023-08-29 NOTE — Patient Instructions (Addendum)
 Nice to see you.  We will check your lab work today.  Continue to take your medication daily as prescribed.  Refills have been sent to the pharmacy.  Plan for follow up in 6 months or sooner if needed with lab work on the same day.  Have a great day and stay safe!   Smoking Hazards Smoking cigarettes is extremely bad for your health. Tobacco smoke has over 200 known poisons in it. It contains the poisonous gases nitrogen oxide and carbon monoxide. There are over 60 chemicals in tobacco smoke that cause cancer. Some of the chemicals found in cigarette smoke include:  Cyanide.   Benzene.   Formaldehyde.   Methanol (wood alcohol).   Acetylene (fuel used in welding torches).   Ammonia.   Even smoking lightly shortens your life expectancy by several years. You can greatly reduce the risk of medical problems for you and your family by stopping now. Smoking is the most preventable cause of death and disease in our society. Within days of quitting smoking, your circulation improves, you decrease the risk of having a heart attack, and your lung capacity improves. There may be some increased phlegm in the first few days after quitting, and it may take months for your lungs to clear up completely. Quitting for 10 years reduces your risk of developing lung cancer to almost that of a nonsmoker.  WHAT ARE THE RISKS OF SMOKING? Cigarette smokers have an increased risk of many serious medical problems, including: Lung cancer.   Lung disease (such as pneumonia, bronchitis, and emphysema).   Heart attack and chest pain due to the heart not getting enough oxygen (angina).   Heart disease and peripheral blood vessel disease.   Hypertension.   Stroke.   Oral cancer (cancer of the lip, mouth, or voice box).   Bladder cancer.   Pancreatic cancer.   Cervical cancer.   Pregnancy complications, including premature birth.   Stillbirths and smaller newborn babies, birth defects, and genetic damage to sperm.    Early menopause.   Lower estrogen level for women.   Infertility.   Facial wrinkles.   Blindness.   Increased risk of broken bones (fractures).   Senile dementia.   Stomach ulcers and internal bleeding.   Delayed wound healing and increased risk of complications during surgery. Because of secondhand smoke exposure, children of smokers have an increased risk of the following:  Sudden infant death syndrome (SIDS).   Respiratory infections.   Lung cancer.   Heart disease.   Ear infections.   WHY IS SMOKING ADDICTIVE? Nicotine is the chemical agent in tobacco that is capable of causing addiction or dependence. When you smoke and inhale, nicotine is absorbed rapidly into the bloodstream through your lungs. Both inhaled and noninhaled nicotine may be addictive.  WHAT ARE THE BENEFITS OF QUITTING?  There are many health benefits to quitting smoking. Some are:  The likelihood of developing cancer and heart disease decreases. Health improvements are seen almost immediately.   Blood pressure, pulse rate, and breathing patterns start returning to normal soon after quitting.   People who quit may see an improvement in their overall quality of life.   HOW DO YOU QUIT SMOKING? Smoking is an addiction with both physical and psychological effects, and longtime habits can be hard to change. Your health care provider can recommend: Programs and community resources, which may include group support, education, or therapy. Replacement products, such as patches, gum, and nasal sprays. Use these products only as  directed. Do not replace cigarette smoking with electronic cigarettes (commonly called e-cigarettes). The safety of e-cigarettes is unknown, and some may contain harmful chemicals. FOR MORE INFORMATION American Lung Association: www.lung.org American Cancer Society: www.cancer.org   This information is not intended to replace advice given to you by your health care provider. Make sure you discuss  any questions you have with your health care provider.   Document Released: 07/08/2004 Document Revised: 03/21/2013 Document Reviewed: 11/20/2012 Elsevier Interactive Patient Education 2016 [employer].  Steps to Quit Smoking  Smoking tobacco can be harmful to your health and can affect almost every organ in your body. Smoking puts you, and those around you, at risk for developing many serious chronic diseases. Quitting smoking is difficult, but it is one of the best things that you can do for your health. It is never too late to quit. WHAT ARE THE BENEFITS OF QUITTING SMOKING? When you quit smoking, you lower your risk of developing serious diseases and conditions, such as: Lung cancer or lung disease, such as COPD. Heart disease. Stroke. Heart attack. Infertility. Osteoporosis and bone fractures. Additionally, symptoms such as coughing, wheezing, and shortness of breath may get better when you quit. You may also find that you get sick less often because your body is stronger at fighting off colds and infections. If you are pregnant, quitting smoking can help to reduce your chances of having a baby of low birth weight. HOW DO I GET READY TO QUIT? When you decide to quit smoking, create a plan to make sure that you are successful. Before you quit: Pick a date to quit. Set a date within the next two weeks to give you time to prepare. Write down the reasons why you are quitting. Keep this list in places where you will see it often, such as on your bathroom mirror or in your car or wallet. Identify the people, places, things, and activities that make you want to smoke (triggers) and avoid them. Make sure to take these actions: Throw away all cigarettes at home, at work, and in your car. Throw away smoking accessories, such as Set designer. Clean your car and make sure to empty the ashtray. Clean your home, including curtains and carpets. Tell your family, friends, and coworkers that  you are quitting. Support from your loved ones can make quitting easier. Talk with your health care provider about your options for quitting smoking. Find out what treatment options are covered by your health insurance. WHAT STRATEGIES CAN I USE TO QUIT SMOKING?  Talk with your healthcare provider about different strategies to quit smoking. Some strategies include: Quitting smoking altogether instead of gradually lessening how much you smoke over a period of time. Research shows that quitting "cold [place]" is more successful than gradually quitting. Attending in-person counseling to help you build problem-solving skills. You are more likely to have success in quitting if you attend several counseling sessions. Even short sessions of 10 minutes can be effective. Finding resources and support systems that can help you to quit smoking and remain smoke-free after you quit. These resources are most helpful when you use them often. They can include: Online chats with a Veterinary surgeon. Telephone quitlines. Printed Materials engineer. Support groups or group counseling. Text messaging programs. Mobile phone applications. Taking medicines to help you quit smoking. (If you are pregnant or breastfeeding, talk with your health care provider first.) Some medicines contain nicotine and some do not. Both types of medicines help with cravings, but the  medicines that include nicotine help to relieve withdrawal symptoms. Your health care provider may recommend: Nicotine patches, gum, or lozenges. Nicotine inhalers or sprays. Non-nicotine medicine that is taken by mouth. Talk with your health care provider about combining strategies, such as taking medicines while you are also receiving in-person counseling. Using these two strategies together makes you more likely to succeed in quitting than if you used either strategy on its own. If you are pregnant or breastfeeding, talk with your health care provider about finding  counseling or other support strategies to quit smoking. Do not take medicine to help you quit smoking unless told to do so by your health care provider. WHAT THINGS CAN I DO TO MAKE IT EASIER TO QUIT? Quitting smoking might feel overwhelming at first, but there is a lot that you can do to make it easier. Take these important actions: Reach out to your family and friends and ask that they support and encourage you during this time. Call telephone quitlines, reach out to support groups, or work with a counselor for support. Ask people who smoke to avoid smoking around you. Avoid places that trigger you to smoke, such as bars, parties, or smoke-break areas at work. Spend time around people who do not smoke. Lessen stress in your life, because stress can be a smoking trigger for some people. To lessen stress, try: Exercising regularly. Deep-breathing exercises. Yoga. Meditating. Performing a body scan. This involves closing your eyes, scanning your body from head to toe, and noticing which parts of your body are particularly tense. Purposefully relax the muscles in those areas. Download or purchase mobile phone or tablet apps (applications) that can help you stick to your quit plan by providing reminders, tips, and encouragement. There are many free apps, such as QuitGuide from the [employer] developer for Disease Control and Prevention). You can find other support for quitting smoking (smoking cessation) through smokefree.gov and other websites. HOW WILL I FEEL WHEN I QUIT SMOKING? Within the first 24 hours of quitting smoking, you may start to feel some withdrawal symptoms. These symptoms are usually most noticeable 2-3 days after quitting, but they usually do not last beyond 2-3 weeks. Changes or symptoms that you might experience include: Mood swings. Restlessness, anxiety, or irritation. Difficulty concentrating. Dizziness. Strong cravings for sugary foods in addition to nicotine. Mild weight  gain. Constipation. Nausea. Coughing or a sore throat. Changes in how your medicines work in your body. A depressed mood. Difficulty sleeping (insomnia). After the first 2-3 weeks of quitting, you may start to notice more positive results, such as: Improved sense of smell and taste. Decreased coughing and sore throat. Slower heart rate. Lower blood pressure. Clearer skin. The ability to breathe more easily. Fewer sick days. Quitting smoking is very challenging for most people. Do not get discouraged if you are not successful the first time. Some people need to make many attempts to quit before they achieve long-term success. Do your best to stick to your quit plan, and talk with your health care provider if you have any questions or concerns.   This information is not intended to replace advice given to you by your health care provider. Make sure you discuss any questions you have with your health care provider.   Document Released: 05/25/2001 Document Revised: 10/15/2014 Document Reviewed: 10/15/2014 Elsevier Interactive Patient Education 2016 [employer].  Smoking Cessation, Tips for Success If you are ready to quit smoking, congratulations! You have chosen to help yourself be healthier. Cigarettes bring  nicotine, tar, carbon monoxide, and other irritants into your body. Your lungs, heart, and blood vessels will be able to work better without these poisons. There are many different ways to quit smoking. Nicotine gum, nicotine patches, a nicotine inhaler, or nicotine nasal spray can help with physical craving. Hypnosis, support groups, and medicines help break the habit of smoking. WHAT THINGS CAN I DO TO MAKE QUITTING EASIER?  Here are some tips to help you quit for good: Pick a date when you will quit smoking completely. Tell all of your friends and family about your plan to quit on that date. Do not try to slowly cut down on the number of cigarettes you are smoking. Pick a quit date  and quit smoking completely starting on that day. Throw away all cigarettes.   Clean and remove all ashtrays from your home, work, and car. On a card, write down your reasons for quitting. Carry the card with you and read it when you get the urge to smoke. Cleanse your body of nicotine. Drink enough water and fluids to keep your urine clear or pale yellow. Do this after quitting to flush the nicotine from your body. Learn to predict your moods. Do not let a bad situation be your excuse to have a cigarette. Some situations in your life might tempt you into wanting a cigarette. Never have "just one" cigarette. It leads to wanting another and another. Remind yourself of your decision to quit. Change habits associated with smoking. If you smoked while driving or when feeling stressed, try other activities to replace smoking. Stand up when drinking your coffee. Brush your teeth after eating. Sit in a different chair when you read the paper. Avoid alcohol while trying to quit, and try to drink fewer caffeinated beverages. Alcohol and caffeine may urge you to smoke. Avoid foods and drinks that can trigger a desire to smoke, such as sugary or spicy foods and alcohol. Ask people who smoke not to smoke around you. Have something planned to do right after eating or having a cup of coffee. For example, plan to take a walk or exercise. Try a relaxation exercise to calm you down and decrease your stress. Remember, you may be tense and nervous for the first 2 weeks after you quit, but this will pass. Find new activities to keep your hands busy. Play with a pen, coin, or rubber band. Doodle or draw things on paper. Brush your teeth right after eating. This will help cut down on the craving for the taste of tobacco after meals. You can also try mouthwash.   Use oral substitutes in place of cigarettes. Try using lemon drops, carrots, cinnamon sticks, or chewing gum. Keep them handy so they are available when you have  the urge to smoke. When you have the urge to smoke, try deep breathing. Designate your home as a nonsmoking area. If you are a heavy smoker, ask your health care provider about a prescription for nicotine chewing gum. It can ease your withdrawal from nicotine. Reward yourself. Set aside the cigarette money you save and buy yourself something nice. Look for support from others. Join a support group or smoking cessation program. Ask someone at home or at work to help you with your plan to quit smoking. Always ask yourself, "Do I need this cigarette or is this just a reflex?" Tell yourself, "Today, I choose not to smoke," or "I do not want to smoke." You are reminding yourself of your decision to quit.  Do not replace cigarette smoking with electronic cigarettes (commonly called e-cigarettes). The safety of e-cigarettes is unknown, and some may contain harmful chemicals. If you relapse, do not give up! Plan ahead and think about what you will do the next time you get the urge to smoke. HOW WILL I FEEL WHEN I QUIT SMOKING? You may have symptoms of withdrawal because your body is used to nicotine (the addictive substance in cigarettes). You may crave cigarettes, be irritable, feel very hungry, cough often, get headaches, or have difficulty concentrating. The withdrawal symptoms are only temporary. They are strongest when you first quit but will go away within 10-14 days. When withdrawal symptoms occur, stay in control. Think about your reasons for quitting. Remind yourself that these are signs that your body is healing and getting used to being without cigarettes. Remember that withdrawal symptoms are easier to treat than the major diseases that smoking can cause.  Even after the withdrawal is over, expect periodic urges to smoke. However, these cravings are generally short lived and will go away whether you smoke or not. Do not smoke! WHAT RESOURCES ARE AVAILABLE TO HELP ME QUIT SMOKING? Your health care  provider can direct you to community resources or hospitals for support, which may include: Group support. Education. Hypnosis. Therapy.   This information is not intended to replace advice given to you by your health care provider. Make sure you discuss any questions you have with your health care provider.   Document Released: 02/27/2004 Document Revised: 06/21/2014 Document Reviewed: 11/16/2012 Elsevier Interactive Patient Education [employer]
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
## 2023-08-29 NOTE — Assessment & Plan Note (Signed)
 Mr. Carl Schultz continues to have well-controlled virus with good adherence and tolerance to Symtuza.  Reviewed previous lab work and discussed plan of care and U equals U.  Check blood work.  Continue current dose of Symtuza.  Social determinants of health reviewed with no interventions indicated.  Covered by Medicare.  Plan for follow-up in 6 months or sooner if needed with lab work on the same day.

## 2023-08-30 LAB — COMPLETE METABOLIC PANEL WITH GFR
AG Ratio: 1.3 (calc) (ref 1.0–2.5)
ALT: 15 U/L (ref 9–46)
AST: 18 U/L (ref 10–35)
Albumin: 4.5 g/dL (ref 3.6–5.1)
Alkaline phosphatase (APISO): 38 U/L (ref 35–144)
BUN: 16 mg/dL (ref 7–25)
CO2: 29 mmol/L (ref 20–32)
Calcium: 9.8 mg/dL (ref 8.6–10.3)
Chloride: 105 mmol/L (ref 98–110)
Creat: 1.11 mg/dL (ref 0.70–1.30)
Globulin: 3.4 g/dL (ref 1.9–3.7)
Glucose, Bld: 75 mg/dL (ref 65–99)
Potassium: 3.9 mmol/L (ref 3.5–5.3)
Sodium: 142 mmol/L (ref 135–146)
Total Bilirubin: 0.7 mg/dL (ref 0.2–1.2)
Total Protein: 7.9 g/dL (ref 6.1–8.1)
eGFR: 80 mL/min/{1.73_m2} (ref >=60)

## 2023-08-31 LAB — T-HELPER CELLS (CD4) COUNT (NOT AT ARMC)
Absolute CD4: 1044 {cells}/uL (ref 490–1740)
CD4 T Helper %: 47 % (ref 30–61)
Total lymphocyte count: 2202 {cells}/uL (ref 850–3900)

## 2023-08-31 LAB — COMPREHENSIVE METABOLIC PANEL
AG Ratio: 1.3 (calc) (ref 1.0–2.5)
ALT: 15 U/L (ref 9–46)
AST: 18 U/L (ref 10–35)
Albumin: 4.5 g/dL (ref 3.6–5.1)
Alkaline phosphatase (APISO): 38 U/L (ref 35–144)
BUN: 16 mg/dL (ref 7–25)
CO2: 29 mmol/L (ref 20–32)
Calcium: 9.8 mg/dL (ref 8.6–10.3)
Chloride: 105 mmol/L (ref 98–110)
Creat: 1.11 mg/dL (ref 0.70–1.30)
Globulin: 3.4 g/dL (ref 1.9–3.7)
Glucose, Bld: 75 mg/dL (ref 65–99)
Potassium: 3.9 mmol/L (ref 3.5–5.3)
Sodium: 142 mmol/L (ref 135–146)
Total Bilirubin: 0.7 mg/dL (ref 0.2–1.2)
Total Protein: 7.9 g/dL (ref 6.1–8.1)
eGFR: 80 mL/min/{1.73_m2} (ref >=60)

## 2023-08-31 LAB — HIV-1 RNA QUANT-NO REFLEX-BLD
HIV 1 RNA Quant: <20 {copies}/mL — AB
HIV-1 RNA Quant, Log: <1.3 {Log_copies}/mL — AB

## 2023-11-03 ENCOUNTER — Telehealth: Payer: Self-pay

## 2023-11-03 NOTE — Telephone Encounter (Signed)
 The 10-year ASCVD risk score (Arnett DK, et al., 2019) is: 5.5%   Values used to calculate the score:     Age: 53 years     Sex: Male     Is Non-Hispanic African American: Yes     Diabetic: No     Tobacco smoker: No     Systolic Blood Pressure: 127 mmHg     Is BP treated: No     HDL Cholesterol: 59 mg/dL     Total Cholesterol: 234 mg/dL  Currently prescribed rosuvastatin  10 mg.  Areanna Gengler, BSN, RN

## 2024-02-21 ENCOUNTER — Ambulatory Visit (INDEPENDENT_AMBULATORY_CARE_PROVIDER_SITE_OTHER): Payer: Medicare (Managed Care) | Admitting: Family

## 2024-02-21 ENCOUNTER — Encounter: Payer: Self-pay | Admitting: Family

## 2024-02-21 ENCOUNTER — Other Ambulatory Visit: Payer: Self-pay

## 2024-02-21 VITALS — BP 155/78 | HR 76 | Temp 97.9°F | Wt 155.0 lb

## 2024-02-21 DIAGNOSIS — Z23 Encounter for immunization: Secondary | ICD-10-CM | POA: Diagnosis not present

## 2024-02-21 DIAGNOSIS — B20 Human immunodeficiency virus [HIV] disease: Secondary | ICD-10-CM

## 2024-02-21 DIAGNOSIS — Z9189 Other specified personal risk factors, not elsewhere classified: Secondary | ICD-10-CM

## 2024-02-21 DIAGNOSIS — Z0000 Encounter for general adult medical examination without abnormal findings: Secondary | ICD-10-CM | POA: Diagnosis not present

## 2024-02-21 DIAGNOSIS — Z79899 Other long term (current) drug therapy: Secondary | ICD-10-CM | POA: Diagnosis not present

## 2024-02-21 DIAGNOSIS — F17210 Nicotine dependence, cigarettes, uncomplicated: Secondary | ICD-10-CM

## 2024-02-21 MED ORDER — SYMTUZA 800-150-200-10 MG PO TABS: 1.0000 | ORAL_TABLET | Freq: Every day | ORAL | 6 refills | Status: AC

## 2024-02-21 NOTE — Assessment & Plan Note (Signed)
 Discussed importance of safe sexual practice and condom use. Condoms and site specific STD testing offered.  Vaccinations reviewed and influenza updated Due for colonoscopy and will contact GI to complete in the setting of positive cologuard testing.  Due for routine dental care with referral placed to [facility].

## 2024-02-21 NOTE — Patient Instructions (Addendum)
 Nice to see you.  We will check your lab work today.  Continue to take your medication daily as prescribed.  Refills have been sent to the pharmacy.  Please call [facility]) to schedule/follow up on your dental care at [phone]  Plan for follow up in 6 months or sooner if needed with lab work on the same day.  Have a great day and stay safe!   Mental Health Resources  988: can call or text 24/7  [facility]: Address: [address], [place], [place] [address] Open 24 hours Phone: [phone]  [facility]: Address: [address] [place], [place] [address] Phone: [phone] Appointments: [email or web address]  Smoking Cessation: QuitlineNC 1-800-QUIT-NOW (316)351-2671); Espaol: 1-855-Djelo-Ya (1-878-200-9696) [email or web address]

## 2024-02-21 NOTE — Assessment & Plan Note (Signed)
 Carl Schultz continues to have well-controlled virus with good adherence and tolerance to Symtuza .  Reviewed previous lab work and discussed plan of care and U equals U.  Covered by Medicare/Medicaid and no problems obtaining medication.  Social determinants of health reviewed with no interventions indicated.  Check blood work.  Continue current dose of Symtuza .  Sample provided to bridge to next refill.  Plan for follow-up in 6 months or sooner if needed with lab work on the same day.

## 2024-02-21 NOTE — Assessment & Plan Note (Signed)
 Continues to smoke tobacco. Counseled on the dangers of tobacco not ready to quit at this time.  Reviewed strategies to maximize success, including removing cigarettes and smoking materials from environment, stress management, substitution of other forms of reinforcement, support of family/friends, and written materials.
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
## 2024-02-21 NOTE — Assessment & Plan Note (Signed)
 Maintained on rosuvastatin  with no adverse side effects or myalgias.  ASCVD risk of 13.1%.  Continue current dose of rosuvastatin  for reduction of cardiovascular disease and HIV associated inflammation.

## 2024-02-22 LAB — T-HELPER CELL (CD4) - (RCID CLINIC ONLY)
CD4 % Helper T Cell: 47 % (ref 33–65)
CD4 T Cell Abs: 688 /uL (ref 400–1790)

## 2024-02-23 ENCOUNTER — Other Ambulatory Visit: Payer: Self-pay | Admitting: Pharmacist

## 2024-02-23 ENCOUNTER — Ambulatory Visit: Payer: Self-pay | Admitting: Family

## 2024-02-23 DIAGNOSIS — B20 Human immunodeficiency virus [HIV] disease: Secondary | ICD-10-CM

## 2024-02-23 LAB — COMPREHENSIVE METABOLIC PANEL WITH GFR
AG Ratio: 1.3 (calc) (ref 1.0–2.5)
ALT: 17 U/L (ref 9–46)
AST: 18 U/L (ref 10–35)
Albumin: 4.3 g/dL (ref 3.6–5.1)
Alkaline phosphatase (APISO): 40 U/L (ref 35–144)
BUN: 10 mg/dL (ref 7–25)
CO2: 28 mmol/L (ref 20–32)
Calcium: 9.5 mg/dL (ref 8.6–10.3)
Chloride: 105 mmol/L (ref 98–110)
Creat: 1.13 mg/dL (ref 0.70–1.30)
Globulin: 3.4 g/dL (ref 1.9–3.7)
Glucose, Bld: 73 mg/dL (ref 65–99)
Potassium: 4.2 mmol/L (ref 3.5–5.3)
Sodium: 138 mmol/L (ref 135–146)
Total Bilirubin: 0.7 mg/dL (ref 0.2–1.2)
Total Protein: 7.7 g/dL (ref 6.1–8.1)
eGFR: 79 mL/min/1.73m2 (ref >=60)

## 2024-02-23 LAB — HIV-1 RNA QUANT-NO REFLEX-BLD
HIV 1 RNA Quant: 169 {copies}/mL — ABNORMAL HIGH
HIV-1 RNA Quant, Log: 2.23 {Log_copies}/mL — ABNORMAL HIGH

## 2024-02-23 LAB — LIPID PANEL
Cholesterol: 223 mg/dL — ABNORMAL HIGH (ref <200)
HDL: 60 mg/dL (ref >=40)
LDL Cholesterol (Calc): 144 mg/dL — ABNORMAL HIGH
Non-HDL Cholesterol (Calc): 163 mg/dL — ABNORMAL HIGH (ref <130)
Total CHOL/HDL Ratio: 3.7 (calc) (ref <5.0)
Triglycerides: 86 mg/dL (ref <150)

## 2024-02-23 MED ORDER — SYMTUZA 800-150-200-10 MG PO TABS: 1.0000 | ORAL_TABLET | Freq: Every day | ORAL | Status: AC

## 2024-02-23 NOTE — Progress Notes (Signed)
 Medication Samples have been provided to the patient.  Drug name: Symtuza         Strength: 800/150/200/10 mg Qty: 30  Tablets (1 bottles) LOT: [id number]   Exp.Date: 9/25  Samples requested by Marlan Silva, NP.  Dosing instructions: Take one tablet by mouth once daily with food  The patient has been instructed regarding the correct time, dose, and frequency of taking this medication, including desired effects and most common side effects.   Nicklas Barns, PharmD, CPP, BCIDP, AAHIVP Clinical Pharmacist Practitioner Infectious Diseases Clinical Pharmacist [facility]

## 2024-08-27 ENCOUNTER — Ambulatory Visit: Payer: Medicare (Managed Care) | Admitting: Family
